# Patient Record
Sex: Female | Born: 1990
Health system: Southern US, Community
[De-identification: ages and names within clinical notes are randomized; demographics above are authoritative.]

## PROBLEM LIST (undated history)

## (undated) DIAGNOSIS — Z789 Other specified health status: Secondary | ICD-10-CM

---
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
## 2010-01-31 ENCOUNTER — Emergency Department (HOSPITAL_COMMUNITY): Admission: EM | Admit: 2010-01-31 | Discharge: 2010-01-31 | Payer: Self-pay | Admitting: Emergency Medicine

## 2010-02-15 ENCOUNTER — Emergency Department (HOSPITAL_COMMUNITY): Admission: EM | Admit: 2010-02-15 | Discharge: 2010-02-15 | Payer: Self-pay | Admitting: Emergency Medicine

## 2010-03-31 ENCOUNTER — Emergency Department (HOSPITAL_COMMUNITY): Admission: EM | Admit: 2010-03-31 | Discharge: 2010-03-31 | Payer: Self-pay | Admitting: Emergency Medicine

## 2010-05-24 ENCOUNTER — Inpatient Hospital Stay (HOSPITAL_COMMUNITY): Admission: AD | Admit: 2010-05-24 | Discharge: 2010-05-26 | Payer: Self-pay | Admitting: Family Medicine

## 2010-05-24 ENCOUNTER — Ambulatory Visit: Payer: Self-pay | Admitting: Obstetrics and Gynecology

## 2011-03-14 LAB — CBC
MCHC: 33.1 g/dL (ref 30.0–36.0)
MCV: 72.8 fL — ABNORMAL LOW (ref 78.0–100.0)
Platelets: 229 10*3/uL (ref 150–400)
Platelets: 240 10*3/uL (ref 150–400)
RBC: 3.91 MIL/uL (ref 3.87–5.11)
RDW: 18.3 % — ABNORMAL HIGH (ref 11.5–15.5)
RDW: 18.8 % — ABNORMAL HIGH (ref 11.5–15.5)
WBC: 14.4 10*3/uL — ABNORMAL HIGH (ref 4.0–10.5)

## 2011-03-14 LAB — RPR: RPR Ser Ql: NONREACTIVE

## 2011-03-16 LAB — URINALYSIS, ROUTINE W REFLEX MICROSCOPIC
Bilirubin Urine: NEGATIVE
Nitrite: NEGATIVE
Specific Gravity, Urine: 1.02 (ref 1.005–1.030)
Urobilinogen, UA: 0.2 mg/dL (ref 0.0–1.0)
pH: 6.5 (ref 5.0–8.0)

## 2011-03-16 LAB — URINE MICROSCOPIC-ADD ON
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
## 2014-04-30 ENCOUNTER — Ambulatory Visit (INDEPENDENT_AMBULATORY_CARE_PROVIDER_SITE_OTHER): Payer: Medicaid Other | Admitting: Advanced Practice Midwife

## 2014-04-30 ENCOUNTER — Encounter: Payer: Self-pay | Admitting: Advanced Practice Midwife

## 2014-04-30 VITALS — BP 122/78 | Ht 59.0 in | Wt 137.0 lb

## 2014-04-30 DIAGNOSIS — Z3201 Encounter for pregnancy test, result positive: Secondary | ICD-10-CM

## 2014-04-30 LAB — POCT URINE PREGNANCY: PREG TEST UR: POSITIVE

## 2014-04-30 NOTE — Progress Notes (Signed)
Patient ID: Verlan FriendsNathaly Broom, female   DOB: 08/04/1991, 23 y.o.   MRN: [id number] Pt is here today for pregnancy test, resulted positive.  LMP was 11/29/13, home pregnancy test in Jan, Feb and March all negative, did not take any tests after the first of March.  Will get Quant HCG today and call with results tomorrow to determine when to have her return for u/s and new OB appt.

## 2014-05-01 ENCOUNTER — Telehealth: Payer: Self-pay | Admitting: Obstetrics and Gynecology

## 2014-05-01 LAB — HCG, QUANTITATIVE, PREGNANCY: HCG, BETA CHAIN, QUANT, S: 710.7 m[IU]/mL

## 2014-05-01 NOTE — Telephone Encounter (Signed)
Pt aware of results and aware to have blood work redrawn tomorrow per [employer]. Pt aware that she will have to pay the same as before. Pt switched up front to make appointment.

## 2014-05-02 ENCOUNTER — Other Ambulatory Visit: Payer: Medicaid Other

## 2014-05-02 DIAGNOSIS — Z3200 Encounter for pregnancy test, result unknown: Secondary | ICD-10-CM

## 2014-05-03 LAB — HCG, QUANTITATIVE, PREGNANCY: hCG, Beta Chain, Quant, S: 1718.9 m[IU]/mL

## 2014-05-04 NOTE — Telephone Encounter (Signed)
Pt aware of healthy increase in HCG results.

## 2014-05-26 ENCOUNTER — Other Ambulatory Visit: Payer: Self-pay | Admitting: Obstetrics and Gynecology

## 2014-05-26 DIAGNOSIS — O3680X0 Pregnancy with inconclusive fetal viability, not applicable or unspecified: Secondary | ICD-10-CM

## 2014-05-27 ENCOUNTER — Ambulatory Visit (INDEPENDENT_AMBULATORY_CARE_PROVIDER_SITE_OTHER): Payer: Medicaid Other

## 2014-05-27 ENCOUNTER — Other Ambulatory Visit: Payer: Self-pay | Admitting: Obstetrics and Gynecology

## 2014-05-27 DIAGNOSIS — O26849 Uterine size-date discrepancy, unspecified trimester: Secondary | ICD-10-CM

## 2014-05-27 DIAGNOSIS — O3680X0 Pregnancy with inconclusive fetal viability, not applicable or unspecified: Secondary | ICD-10-CM

## 2014-05-27 DIAGNOSIS — O34219 Maternal care for unspecified type scar from previous cesarean delivery: Secondary | ICD-10-CM

## 2014-05-27 NOTE — Progress Notes (Signed)
U/S-single IUP with +FCA noted, FHR-169 bpm, CRL c/w 8+0wks EDD 01/06/2015, cx appears closed, bilateral adnexa appears WNL with C.L. Noted on Rt

## 2014-06-04 ENCOUNTER — Encounter: Payer: Medicaid Other | Admitting: Advanced Practice Midwife

## 2014-06-11 ENCOUNTER — Other Ambulatory Visit (HOSPITAL_COMMUNITY)
Admission: RE | Admit: 2014-06-11 | Discharge: 2014-06-11 | Disposition: A | Discharge status: Home / Self Care | Payer: Medicaid Other | Source: Ambulatory Visit | Attending: Advanced Practice Midwife | Admitting: Advanced Practice Midwife

## 2014-06-11 ENCOUNTER — Encounter: Payer: Self-pay | Admitting: Advanced Practice Midwife

## 2014-06-11 ENCOUNTER — Ambulatory Visit (INDEPENDENT_AMBULATORY_CARE_PROVIDER_SITE_OTHER): Payer: Medicaid Other | Admitting: Advanced Practice Midwife

## 2014-06-11 VITALS — BP 92/60 | Wt 138.0 lb

## 2014-06-11 DIAGNOSIS — Z01419 Encounter for gynecological examination (general) (routine) without abnormal findings: Secondary | ICD-10-CM | POA: Insufficient documentation

## 2014-06-11 DIAGNOSIS — Z98891 History of uterine scar from previous surgery: Secondary | ICD-10-CM | POA: Insufficient documentation

## 2014-06-11 DIAGNOSIS — Z331 Pregnant state, incidental: Secondary | ICD-10-CM

## 2014-06-11 DIAGNOSIS — Z113 Encounter for screening for infections with a predominantly sexual mode of transmission: Secondary | ICD-10-CM | POA: Insufficient documentation

## 2014-06-11 DIAGNOSIS — O34219 Maternal care for unspecified type scar from previous cesarean delivery: Secondary | ICD-10-CM

## 2014-06-11 DIAGNOSIS — O99011 Anemia complicating pregnancy, first trimester: Secondary | ICD-10-CM

## 2014-06-11 DIAGNOSIS — Z3480 Encounter for supervision of other normal pregnancy, unspecified trimester: Secondary | ICD-10-CM

## 2014-06-11 DIAGNOSIS — Z124 Encounter for screening for malignant neoplasm of cervix: Secondary | ICD-10-CM

## 2014-06-11 DIAGNOSIS — Z1389 Encounter for screening for other disorder: Secondary | ICD-10-CM

## 2014-06-11 LAB — CBC
HEMATOCRIT: 37.4 % (ref 36.0–46.0)
Hemoglobin: 12.9 g/dL (ref 12.0–15.0)
MCH: 29.9 pg (ref 26.0–34.0)
MCHC: 34.5 g/dL (ref 30.0–36.0)
MCV: 86.8 fL (ref 78.0–100.0)
Platelets: 277 10*3/uL (ref 150–400)
RBC: 4.31 MIL/uL (ref 3.87–5.11)
RDW: 13.9 % (ref 11.5–15.5)
WBC: 8.9 10*3/uL (ref 4.0–10.5)

## 2014-06-11 LAB — POCT URINALYSIS DIPSTICK
Blood, UA: NEGATIVE
Glucose, UA: NEGATIVE
Ketones, UA: NEGATIVE
Nitrite, UA: NEGATIVE
PROTEIN UA: NEGATIVE

## 2014-06-11 NOTE — Progress Notes (Signed)
  Subjective:    [name] is a [id number] being seen today for her first obstetrical visit.  Her obstetrical history is significant for hx of cesarean section and hx of VBAC.  Pregnancy history fully reviewed.  Her c/s was after a failed forceps attempt.  2nd baby was an easy VBAC.  Patient reports no complaints.  Filed Vitals:   06/11/14 1411  BP: 92/60  Weight: 138 lb (62.596 kg)    HISTORY: OB History  Gravida Para Term Preterm AB SAB TAB Ectopic Multiple Living  4 2 2  1     2     # Outcome Date GA Lbr Len/2nd Weight Sex Delivery Anes PTL Lv  4 CUR           3 TRM 05/24/10 6265w0d  7 lb 5 oz (3.317 kg) F VBAC EPI  Y  2 TRM 06/22/08 516w0d  8 lb 7 oz (3.827 kg) M CS EPI  Y     Comments: failed forceps  1 ABT 12/2006             History reviewed. No pertinent past medical history. Past Surgical History  Procedure Laterality Date  . Cesarean section  06/22/2008   Family History  Problem Relation Age of Onset  . Diabetes Maternal Grandmother   . Hypertension Maternal Grandmother   . Stroke Maternal Grandmother   . Miscarriages / [place] Mother      Exam       Pelvic Exam:    Perineum: Normal Perineum   Vulva: normal   Vagina:  normal mucosa, normal discharge, no palpable nodules   Uterus            Cervix: Normal  PAP collected   Adnexa: Not palpable   Urinary:  urethral meatus normal    System: Breast:  normal appearance, no masses or tenderness   Skin: normal coloration and turgor, no rashes    Neurologic: oriented, normal, normal mood   Extremities: normal strength, tone, and muscle mass   HEENT PERRLA   Mouth/Teeth mucous membranes moist, normal dentition   Neck supple and no masses   Cardiovascular: regular rate and rhythm   Respiratory:  appears well, vitals normal, no respiratory distress, acyanotic, normal RR   Abdomen: soft, non-tender;  FHR: 150 u/s          Assessment:    Pregnancy: [id number] Patient Active Problem List   Diagnosis Date Noted  . Supervision of other normal pregnancy 06/11/2014  . History of successful vaginal birth after cesarean, currently pregnant 06/11/2014        Plan:     Initial labs drawn. Continue prenatal vitamins  Problem list reviewed and updated  Reviewed n/v relief measures and warning s/s to report  Reviewed recommended weight gain based on pre-gravid BMI  Encouraged well-balanced diet Genetic Screening discussed Integrated Screen: requested.  Ultrasound discussed; fetal survey: requested.  Follow up in 2 weeks for NT/IT  CRESENZO-DISHMAN,FRANCES 06/11/2014

## 2014-06-11 NOTE — Patient Instructions (Signed)

## 2014-06-12 LAB — ABO AND RH: RH TYPE: POSITIVE

## 2014-06-12 LAB — DRUG SCREEN, URINE, NO CONFIRMATION
Amphetamine Screen, Ur: NEGATIVE
BENZODIAZEPINES.: NEGATIVE
Barbiturate Quant, Ur: NEGATIVE
COCAINE METABOLITES: NEGATIVE
CREATININE, U: 163.2 mg/dL
Marijuana Metabolite: NEGATIVE
Methadone: NEGATIVE
Opiate Screen, Urine: NEGATIVE
PHENCYCLIDINE (PCP): NEGATIVE
Propoxyphene: NEGATIVE

## 2014-06-12 LAB — URINALYSIS, ROUTINE W REFLEX MICROSCOPIC
Bilirubin Urine: NEGATIVE
Glucose, UA: NEGATIVE mg/dL
HGB URINE DIPSTICK: NEGATIVE
Ketones, ur: NEGATIVE mg/dL
LEUKOCYTES UA: NEGATIVE
Nitrite: NEGATIVE
PROTEIN: NEGATIVE mg/dL
Specific Gravity, Urine: 1.023 (ref 1.005–1.030)
UROBILINOGEN UA: 1 mg/dL (ref 0.0–1.0)
pH: 8 (ref 5.0–8.0)

## 2014-06-12 LAB — RUBELLA SCREEN: RUBELLA: 1.9 {index} — AB (ref <0.90)

## 2014-06-12 LAB — OXYCODONE SCREEN, UA, RFLX CONFIRM: Oxycodone Screen, Ur: NEGATIVE ng/mL

## 2014-06-12 LAB — HEPATITIS B SURFACE ANTIGEN: HEP B S AG: NEGATIVE

## 2014-06-12 LAB — RPR

## 2014-06-12 LAB — VARICELLA ZOSTER ANTIBODY, IGG: VARICELLA IGG: 735 {index} — AB (ref <135.00)

## 2014-06-12 LAB — ANTIBODY SCREEN: ANTIBODY SCREEN: NEGATIVE

## 2014-06-12 LAB — HIV ANTIBODY (ROUTINE TESTING W REFLEX): HIV 1&2 Ab, 4th Generation: NONREACTIVE

## 2014-06-12 LAB — SICKLE CELL SCREEN: Sickle Cell Screen: NEGATIVE

## 2014-06-13 LAB — URINE CULTURE
Colony Count: NO GROWTH
Organism ID, Bacteria: NO GROWTH

## 2014-06-13 LAB — CYTOLOGY - PAP

## 2014-06-17 LAB — CYSTIC FIBROSIS DIAGNOSTIC STUDY

## 2014-06-25 ENCOUNTER — Encounter: Payer: Self-pay | Admitting: Women's Health

## 2014-06-25 ENCOUNTER — Ambulatory Visit (INDEPENDENT_AMBULATORY_CARE_PROVIDER_SITE_OTHER): Payer: Medicaid Other | Admitting: Women's Health

## 2014-06-25 ENCOUNTER — Encounter: Payer: Medicaid Other | Admitting: Advanced Practice Midwife

## 2014-06-25 ENCOUNTER — Other Ambulatory Visit: Payer: Self-pay | Admitting: Advanced Practice Midwife

## 2014-06-25 ENCOUNTER — Ambulatory Visit (INDEPENDENT_AMBULATORY_CARE_PROVIDER_SITE_OTHER): Payer: Medicaid Other

## 2014-06-25 VITALS — BP 110/76 | Wt 138.0 lb

## 2014-06-25 DIAGNOSIS — Z36 Encounter for antenatal screening of mother: Secondary | ICD-10-CM

## 2014-06-25 DIAGNOSIS — Z1389 Encounter for screening for other disorder: Secondary | ICD-10-CM

## 2014-06-25 DIAGNOSIS — Z3480 Encounter for supervision of other normal pregnancy, unspecified trimester: Secondary | ICD-10-CM

## 2014-06-25 DIAGNOSIS — Z331 Pregnant state, incidental: Secondary | ICD-10-CM

## 2014-06-25 DIAGNOSIS — Z3481 Encounter for supervision of other normal pregnancy, first trimester: Secondary | ICD-10-CM

## 2014-06-25 LAB — POCT URINALYSIS DIPSTICK
GLUCOSE UA: NEGATIVE
Ketones, UA: NEGATIVE
Leukocytes, UA: NEGATIVE
NITRITE UA: NEGATIVE
Protein, UA: NEGATIVE
RBC UA: NEGATIVE

## 2014-06-25 MED ORDER — DOXYLAMINE-PYRIDOXINE 10-10 MG PO TBEC: DELAYED_RELEASE_TABLET | ORAL | Status: DC

## 2014-06-25 NOTE — Progress Notes (Signed)
Pt denies any problems or concerns at this time.  

## 2014-06-25 NOTE — Patient Instructions (Signed)
Nausea & Vomiting  Have saltine crackers or pretzels by your bed and eat a few bites before you raise your head out of bed in the morning  Eat small frequent meals throughout the day instead of large meals  Drink plenty of fluids throughout the day to stay hydrated, just don't drink a lot of fluids with your meals.  This can make your stomach fill up faster making you feel sick  Do not brush your teeth right after you eat  Products with real ginger are good for nausea, like ginger ale and ginger hard candy Make sure it says made with real ginger!  Sucking on sour candy like lemon heads is also good for nausea  If your prenatal vitamins make you nauseated, take them at night so you will sleep through the nausea  If you feel like you need medicine for the nausea & vomiting please let us know  If you are unable to keep any fluids or food down please let us know    Pregnancy - First Trimester During sexual intercourse, millions of sperm go into the vagina. Only 1 sperm will penetrate and fertilize the female egg while it is in the Fallopian tube. One week later, the fertilized egg implants into the wall of the uterus. An embryo begins to develop into a baby. At 6 to 8 weeks, the eyes and face are formed and the heartbeat can be seen on ultrasound. At the end of 12 weeks (first trimester), all the baby's organs are formed. Now that you are pregnant, you will want to do everything you can to have a healthy baby. Two of the most important things are to get good prenatal care and follow your caregiver's instructions. Prenatal care is all the medical care you receive before the baby's birth. It is given to prevent, find, and treat problems during the pregnancy and childbirth. PRENATAL EXAMS  During prenatal visits, your weight, blood pressure, and urine are checked. This is done to make sure you are healthy and progressing normally during the pregnancy.  A pregnant woman should gain 25 to 35 pounds  during the pregnancy. However, if you are overweight or underweight, your caregiver will advise you regarding your weight.  Your caregiver will ask and answer questions for you.  Blood work, cervical cultures, other necessary tests, and a Pap test are done during your prenatal exams. These tests are done to check on your health and the probable health of your baby. Tests are strongly recommended and done for HIV with your permission. This is the virus that causes AIDS. These tests are done because medicines can be given to help prevent your baby from being born with this infection should you have been infected without knowing it. Blood work is also used to find out your blood type, previous infections, and follow your blood levels (hemoglobin).  Low hemoglobin (anemia) is common during pregnancy. Iron and vitamins are given to help prevent this. Later in the pregnancy, blood tests for diabetes will be done along with any other tests if any problems develop.  You may need other tests to make sure you and the baby are doing well. CHANGES DURING THE FIRST TRIMESTER  Your body goes through many changes during pregnancy. They vary from person to person. Talk to your caregiver about changes you notice and are concerned about. Changes can include:  Your menstrual period stops.  The egg and sperm carry the genes that determine what you look like. Genes from you   and your partner are forming a baby. The female genes determine whether the baby is a boy or a girl.  Your body increases in girth and you may feel bloated.  Feeling sick to your stomach (nauseous) and throwing up (vomiting). If the vomiting is uncontrollable, call your caregiver.  Your breasts will begin to enlarge and become tender.  Your nipples may stick out more and become darker.  The need to urinate more. Painful urination may mean you have a bladder infection.  Tiring easily.  Loss of appetite.  Cravings for certain kinds of  food.  At first, you may gain or lose a couple of pounds.  You may have changes in your emotions from day to day (excited to be pregnant or concerned something may go wrong with the pregnancy and baby).  You may have more vivid and strange dreams. HOME CARE INSTRUCTIONS   It is very important to avoid all smoking, alcohol and non-prescribed drugs during your pregnancy. These affect the formation and growth of the baby. Avoid chemicals while pregnant to ensure the delivery of a healthy infant.  Start your prenatal visits by the 12th week of pregnancy. They are usually scheduled monthly at first, then more often in the last 2 months before delivery. Keep your caregiver's appointments. Follow your caregiver's instructions regarding medicine use, blood and lab tests, exercise, and diet.  During pregnancy, you are providing food for you and your baby. Eat regular, well-balanced meals. Choose foods such as meat, fish, milk and other low fat dairy products, vegetables, fruits, and whole-grain breads and cereals. Your caregiver will tell you of the ideal weight gain.  You can help morning sickness by keeping soda crackers at the bedside. Eat a couple before arising in the morning. You may want to use the crackers without salt on them.  Eating 4 to 5 small meals rather than 3 large meals a day also may help the nausea and vomiting.  Drinking liquids between meals instead of during meals also seems to help nausea and vomiting.  A physical sexual relationship may be continued throughout pregnancy if there are no other problems. Problems may be early (premature) leaking of amniotic fluid from the membranes, vaginal bleeding, or belly (abdominal) pain.  Exercise regularly if there are no restrictions. Check with your caregiver or physical therapist if you are unsure of the safety of some of your exercises. Greater weight gain will occur in the last 2 trimesters of pregnancy. Exercising will  help:  Control your weight.  Keep you in shape.  Prepare you for labor and delivery.  Help you lose your pregnancy weight after you deliver your baby.  Wear a good support or jogging bra for breast tenderness during pregnancy. This may help if worn during sleep too.  Ask when prenatal classes are available. Begin classes when they are offered.  Do not use hot tubs, steam rooms, or saunas.  Wear your seat belt when driving. This protects you and your baby if you are in an accident.  Avoid raw meat, uncooked cheese, cat litter boxes, and soil used by cats throughout the pregnancy. These carry germs that can cause birth defects in the baby.  The first trimester is a good time to visit your dentist for your dental health. Getting your teeth cleaned is okay. Use a softer toothbrush and brush gently during pregnancy.  Ask for help if you have financial, counseling, or nutritional needs during pregnancy. Your caregiver will be able to offer counseling for   these needs as well as refer you for other special needs.  Do not take any medicines or herbs unless told by your caregiver.  Inform your caregiver if there is any mental or physical domestic violence.  Make a list of emergency phone numbers of family, friends, hospital, and police and fire departments.  Write down your questions. Take them to your prenatal visit.  Do not douche.  Do not cross your legs.  If you have to stand for long periods of time, rotate you feet or take small steps in a circle.  You may have more vaginal secretions that may require a sanitary pad. Do not use tampons or scented sanitary pads. MEDICINES AND DRUG USE IN PREGNANCY  Take prenatal vitamins as directed. The vitamin should contain 1 milligram of folic acid. Keep all vitamins out of reach of children. Only a couple vitamins or tablets containing iron may be fatal to a baby or young child when ingested.  Avoid use of all medicines, including herbs,  over-the-counter medicines, not prescribed or suggested by your caregiver. Only take over-the-counter or prescription medicines for pain, discomfort, or fever as directed by your caregiver. Do not use aspirin, ibuprofen, or naproxen unless directed by your caregiver.  Let your caregiver also know about herbs you may be using.  Alcohol is related to a number of birth defects. This includes fetal alcohol syndrome. All alcohol, in any form, should be avoided completely. Smoking will cause low birth rate and premature babies.  Street or illegal drugs are very harmful to the baby. They are absolutely forbidden. A baby born to an addicted mother will be addicted at birth. The baby will go through the same withdrawal an adult does.  Let your caregiver know about any medicines that you have to take and for what reason you take them. SEEK MEDICAL CARE IF:  You have any concerns or worries during your pregnancy. It is better to call with your questions if you feel they cannot wait, rather than worry about them. SEEK IMMEDIATE MEDICAL CARE IF:   An unexplained oral temperature above 102 F (38.9 C) develops, or as your caregiver suggests.  You have leaking of fluid from the vagina (birth canal). If leaking membranes are suspected, take your temperature and inform your caregiver of this when you call.  There is vaginal spotting or bleeding. Notify your caregiver of the amount and how many pads are used.  You develop a bad smelling vaginal discharge with a change in the color.  You continue to feel sick to your stomach (nauseated) and have no relief from remedies suggested. You vomit blood or coffee ground-like materials.  You lose more than 2 pounds of weight in 1 week.  You gain more than 2 pounds of weight in 1 week and you notice swelling of your face, hands, feet, or legs.  You gain 5 pounds or more in 1 week (even if you do not have swelling of your hands, face, legs, or feet).  You get  exposed to German measles and have never had them.  You are exposed to fifth disease or chickenpox.  You develop belly (abdominal) pain. Round ligament discomfort is a common non-cancerous (benign) cause of abdominal pain in pregnancy. Your caregiver still must evaluate this.  You develop headache, fever, diarrhea, pain with urination, or shortness of breath.  You fall or are in a car accident or have any kind of trauma.  There is mental or physical violence in your home. Document   Released: 12/06/2001 Document Revised: 09/05/2012 Document Reviewed: 10/22/2013 [employer] Patient Information 2015 [employer]. This information is not intended to replace advice given to you by your health care provider. Make sure you discuss any questions you have with your health care provider.  

## 2014-06-25 NOTE — Progress Notes (Signed)
Low-risk OB appointment [id number] Estimated Date of Delivery: 01/06/15 BP 110/76  Wt 138 lb (62.596 kg)  LMP 11/29/2013  BP, weight, and urine reviewed.  Refer to obstetrical flow sheet for FH & FHR.  Denies lof, vb, or uti s/s. Some cramping, some ha's. Nausea daily, no vomiting- requests meds.  Reviewed warning s/s to report. Recommended increasing po hydration, emptying bladder frequently, small frequent snacks/meals, apap prn.  Plan:  Continue routine obstetrical care  F/U in 4wks for OB appointment & 2nd IT Rx diclegis, prior auth sent, and 2 samples given.  1st IT/NT today

## 2014-06-25 NOTE — Progress Notes (Signed)
U/S(12+1wks)-single IUP with +FCA noted, FHR-154 bpm, cx appears closed, bilateral adnexa appears WNL with C.L. Noted on the Rt-2.6cm, no free fluid noted within the pelvis, CRL c/w dates, NB present, NT-1.6944mm, anterior Gr 0 placenta

## 2014-07-02 LAB — MATERNAL SCREEN, INTEGRATED #1

## 2014-07-23 ENCOUNTER — Ambulatory Visit (INDEPENDENT_AMBULATORY_CARE_PROVIDER_SITE_OTHER): Payer: Medicaid Other | Admitting: Women's Health

## 2014-07-23 ENCOUNTER — Encounter: Payer: Self-pay | Admitting: Women's Health

## 2014-07-23 VITALS — BP 98/58 | Wt 143.0 lb

## 2014-07-23 DIAGNOSIS — Z3480 Encounter for supervision of other normal pregnancy, unspecified trimester: Secondary | ICD-10-CM

## 2014-07-23 DIAGNOSIS — Z331 Pregnant state, incidental: Secondary | ICD-10-CM

## 2014-07-23 DIAGNOSIS — Z1389 Encounter for screening for other disorder: Secondary | ICD-10-CM

## 2014-07-23 DIAGNOSIS — O34219 Maternal care for unspecified type scar from previous cesarean delivery: Secondary | ICD-10-CM

## 2014-07-23 DIAGNOSIS — Z3482 Encounter for supervision of other normal pregnancy, second trimester: Secondary | ICD-10-CM

## 2014-07-23 LAB — POCT URINALYSIS DIPSTICK
Blood, UA: NEGATIVE
Glucose, UA: NEGATIVE
KETONES UA: NEGATIVE
NITRITE UA: NEGATIVE
PROTEIN UA: NEGATIVE

## 2014-07-23 NOTE — Addendum Note (Signed)
Addended by: Gaylyn RongEVANS, Jannae Fagerstrom A on: 07/23/2014 02:12 PM   Modules accepted: Orders

## 2014-07-23 NOTE — Progress Notes (Signed)
Low-risk OB appointment [id number] Estimated Date of Delivery: 01/06/15 BP 98/58  Wt 143 lb (64.864 kg)  LMP 11/29/2013  BP, weight, and urine reviewed.  Refer to obstetrical flow sheet for FH & FHR.  Reports good fm.  Denies regular uc's, lof, vb, or uti s/s. No complaints. Reviewed warning s/s to report. Plan:  Continue routine obstetrical care  F/U in 4wks for OB appointment and anatomy u/s 2nd IT today

## 2014-07-23 NOTE — Patient Instructions (Signed)
Second Trimester of Pregnancy The second trimester is from week 13 through week 28, months 4 through 6. The second trimester is often a time when you feel your best. Your body has also adjusted to being pregnant, and you begin to feel better physically. Usually, morning sickness has lessened or quit completely, you may have more energy, and you may have an increase in appetite. The second trimester is also a time when the fetus is growing rapidly. At the end of the sixth month, the fetus is about 9 inches long and weighs about 1 pounds. You will likely begin to feel the baby move (quickening) between 18 and 20 weeks of the pregnancy. BODY CHANGES Your body goes through many changes during pregnancy. The changes vary from woman to woman.   Your weight will continue to increase. You will notice your lower abdomen bulging out.  You may begin to get stretch marks on your hips, abdomen, and breasts.  You may develop headaches that can be relieved by medicines approved by your health care provider.  You may urinate more often because the fetus is pressing on your bladder.  You may develop or continue to have heartburn as a result of your pregnancy.  You may develop constipation because certain hormones are causing the muscles that push waste through your intestines to slow down.  You may develop hemorrhoids or swollen, bulging veins (varicose veins).  You may have back pain because of the weight gain and pregnancy hormones relaxing your joints between the bones in your pelvis and as a result of a shift in weight and the muscles that support your balance.  Your breasts will continue to grow and be tender.  Your gums may bleed and may be sensitive to brushing and flossing.  Dark spots or blotches (chloasma, mask of pregnancy) may develop on your face. This will likely fade after the baby is born.  A dark line from your belly button to the pubic area (linea nigra) may appear. This will likely fade  after the baby is born.  You may have changes in your hair. These can include thickening of your hair, rapid growth, and changes in texture. Some women also have hair loss during or after pregnancy, or hair that feels dry or thin. Your hair will most likely return to normal after your baby is born. WHAT TO EXPECT AT YOUR PRENATAL VISITS During a routine prenatal visit:  You will be weighed to make sure you and the fetus are growing normally.  Your blood pressure will be taken.  Your abdomen will be measured to track your baby's growth.  The fetal heartbeat will be listened to.  Any test results from the previous visit will be discussed. Your health care provider may ask you:  How you are feeling.  If you are feeling the baby move.  If you have had any abnormal symptoms, such as leaking fluid, bleeding, severe headaches, or abdominal cramping.  If you have any questions. Other tests that may be performed during your second trimester include:  Blood tests that check for:  Low iron levels (anemia).  Gestational diabetes (between 24 and 28 weeks).  Rh antibodies.  Urine tests to check for infections, diabetes, or protein in the urine.  An ultrasound to confirm the proper growth and development of the baby.  An amniocentesis to check for possible genetic problems.  Fetal screens for spina bifida and Down syndrome. HOME CARE INSTRUCTIONS   Avoid all smoking, herbs, alcohol, and unprescribed   drugs. These chemicals affect the formation and growth of the baby.  Follow your health care provider's instructions regarding medicine use. There are medicines that are either safe or unsafe to take during pregnancy.  Exercise only as directed by your health care provider. Experiencing uterine cramps is a good sign to stop exercising.  Continue to eat regular, healthy meals.  Wear a good support bra for breast tenderness.  Do not use hot tubs, steam rooms, or saunas.  Wear your  seat belt at all times when driving.  Avoid raw meat, uncooked cheese, cat litter boxes, and soil used by cats. These carry germs that can cause birth defects in the baby.  Take your prenatal vitamins.  Try taking a stool softener (if your health care provider approves) if you develop constipation. Eat more high-fiber foods, such as fresh vegetables or fruit and whole grains. Drink plenty of fluids to keep your urine clear or pale yellow.  Take warm sitz baths to soothe any pain or discomfort caused by hemorrhoids. Use hemorrhoid cream if your health care provider approves.  If you develop varicose veins, wear support hose. Elevate your feet for 15 minutes, 3-4 times a day. Limit salt in your diet.  Avoid heavy lifting, wear low heel shoes, and practice good posture.  Rest with your legs elevated if you have leg cramps or low back pain.  Visit your dentist if you have not gone yet during your pregnancy. Use a soft toothbrush to brush your teeth and be gentle when you floss.  A sexual relationship may be continued unless your health care provider directs you otherwise.  Continue to go to all your prenatal visits as directed by your health care provider. SEEK MEDICAL CARE IF:   You have dizziness.  You have mild pelvic cramps, pelvic pressure, or nagging pain in the abdominal area.  You have persistent nausea, vomiting, or diarrhea.  You have a bad smelling vaginal discharge.  You have pain with urination. SEEK IMMEDIATE MEDICAL CARE IF:   You have a fever.  You are leaking fluid from your vagina.  You have spotting or bleeding from your vagina.  You have severe abdominal cramping or pain.  You have rapid weight gain or loss.  You have shortness of breath with chest pain.  You notice sudden or extreme swelling of your face, hands, ankles, feet, or legs.  You have not felt your baby move in over an hour.  You have severe headaches that do not go away with  medicine.  You have vision changes. Document Released: 12/06/2001 Document Revised: 12/17/2013 Document Reviewed: 02/12/2013 [employer] Patient Information 2015 [employer]. This information is not intended to replace advice given to you by your health care provider. Make sure you discuss any questions you have with your health care provider.  

## 2014-07-28 LAB — MATERNAL SCREEN, INTEGRATED #2
AFP MOM MAT SCREEN: 1.12
AFP, SERUM MAT SCREEN: 36.7 ng/mL
Age risk Down Syndrome: 1:1100 {titer}
CROWN RUMP LENGTH MAT SCREEN 2: 60.3 mm
Calculated Gestational Age: 16.3
ESTRIOL FREE MAT SCREEN: 1.18 ng/mL
ESTRIOL MOM MAT SCREEN: 1.22
HCG, MOM MAT SCREEN: 1.6
Inhibin A Dimeric: 251 pg/mL
Inhibin A MoM: 1.52
MSS Trisomy 18 Risk: <1:5000 {titer}
NT MoM: 1.04
NUCHAL TRANSLUCENCY MAT SCREEN 2: 1.44 mm
NUMBER OF FETUSES MAT SCREEN 2: 1
PAPP-A MAT SCREEN: 368 ng/mL
PAPP-A MoM: 0.54
Rish for ONTD: <1:5000 {titer}
hCG, Serum: 54.5 IU/mL

## 2014-07-29 ENCOUNTER — Encounter: Payer: Self-pay | Admitting: Women's Health

## 2014-08-20 ENCOUNTER — Other Ambulatory Visit: Payer: Self-pay | Admitting: Women's Health

## 2014-08-20 ENCOUNTER — Ambulatory Visit (INDEPENDENT_AMBULATORY_CARE_PROVIDER_SITE_OTHER): Payer: Medicaid Other | Admitting: Advanced Practice Midwife

## 2014-08-20 ENCOUNTER — Ambulatory Visit (INDEPENDENT_AMBULATORY_CARE_PROVIDER_SITE_OTHER): Payer: Medicaid Other

## 2014-08-20 ENCOUNTER — Encounter: Payer: Self-pay | Admitting: Advanced Practice Midwife

## 2014-08-20 VITALS — BP 98/54 | Wt 144.0 lb

## 2014-08-20 DIAGNOSIS — Z1389 Encounter for screening for other disorder: Secondary | ICD-10-CM

## 2014-08-20 DIAGNOSIS — Z331 Pregnant state, incidental: Secondary | ICD-10-CM

## 2014-08-20 DIAGNOSIS — O34219 Maternal care for unspecified type scar from previous cesarean delivery: Secondary | ICD-10-CM

## 2014-08-20 DIAGNOSIS — Z3480 Encounter for supervision of other normal pregnancy, unspecified trimester: Secondary | ICD-10-CM

## 2014-08-20 DIAGNOSIS — Z3482 Encounter for supervision of other normal pregnancy, second trimester: Secondary | ICD-10-CM

## 2014-08-20 DIAGNOSIS — O09299 Supervision of pregnancy with other poor reproductive or obstetric history, unspecified trimester: Secondary | ICD-10-CM

## 2014-08-20 LAB — POCT URINALYSIS DIPSTICK
Blood, UA: NEGATIVE
GLUCOSE UA: NEGATIVE
KETONES UA: NEGATIVE
Nitrite, UA: NEGATIVE
PROTEIN UA: NEGATIVE

## 2014-08-20 NOTE — Progress Notes (Signed)
U/S(20+1wks)-active fetus, meas c/w dates, fluid wnl, anterior Gr 0 placenta, cx appears closed (3.3cm), bilateral adnexa appears WNL, FHR-161 bpm, no major abnl noted, female fetus

## 2014-08-20 NOTE — Progress Notes (Signed)
[id number] [redacted]w[redacted]d Estimated Date of Delivery: 01/06/15  Blood pressure 98/54, weight 144 lb (65.318 kg), last menstrual period 11/29/2013.   BP weight and urine results all reviewed and noted.  Please refer to the obstetrical flow sheet for the fundal height and fetal heart rate documentation: Had anatomy scan today: normal female  Patient reports good fetal movement, denies any bleeding and no rupture of membranes symptoms or regular contractions. Patient is without complaints. All questions were answered.  Plan:  Continued routine obstetrical care,   Follow up in 4 weeks for OB appointment,

## 2014-09-17 ENCOUNTER — Ambulatory Visit (INDEPENDENT_AMBULATORY_CARE_PROVIDER_SITE_OTHER): Payer: Medicaid Other | Admitting: Women's Health

## 2014-09-17 ENCOUNTER — Encounter: Payer: Self-pay | Admitting: Women's Health

## 2014-09-17 VITALS — BP 102/60 | Wt 151.0 lb

## 2014-09-17 DIAGNOSIS — Z3482 Encounter for supervision of other normal pregnancy, second trimester: Secondary | ICD-10-CM

## 2014-09-17 DIAGNOSIS — Z1389 Encounter for screening for other disorder: Secondary | ICD-10-CM

## 2014-09-17 DIAGNOSIS — Z3480 Encounter for supervision of other normal pregnancy, unspecified trimester: Secondary | ICD-10-CM

## 2014-09-17 DIAGNOSIS — Z331 Pregnant state, incidental: Secondary | ICD-10-CM

## 2014-09-17 LAB — POCT URINALYSIS DIPSTICK
Blood, UA: NEGATIVE
Glucose, UA: NEGATIVE
KETONES UA: NEGATIVE
LEUKOCYTES UA: NEGATIVE
NITRITE UA: NEGATIVE
Protein, UA: NEGATIVE

## 2014-09-17 NOTE — Progress Notes (Signed)
Low-risk OB appointment [id number] [redacted]w[redacted]d Estimated Date of Delivery: 01/06/15 BP 102/60  Wt 151 lb (68.493 kg)  LMP 11/29/2013  BP, weight, and urine reviewed.  Refer to obstetrical flow sheet for FH & FHR.  Reports good fm.  Denies regular uc's, lof, vb, or uti s/s. Occ episodes where she gets shaky, sweaty, and nauseated- sounds like bp or bs dropping- discussed prevention/relief measures.  Reviewed ptl s/s, fm. Plan:  Continue routine obstetrical care  F/U in 4wks for OB appointment and pn2

## 2014-09-17 NOTE — Patient Instructions (Signed)
You will have your sugar test next visit.  Please do not eat or drink anything after midnight the night before you come, not even water.  You will be here for at least two hours.     Second Trimester of Pregnancy The second trimester is from week 13 through week 28, months 4 through 6. The second trimester is often a time when you feel your best. Your body has also adjusted to being pregnant, and you begin to feel better physically. Usually, morning sickness has lessened or quit completely, you may have more energy, and you may have an increase in appetite. The second trimester is also a time when the fetus is growing rapidly. At the end of the sixth month, the fetus is about 9 inches long and weighs about 1 pounds. You will likely begin to feel the baby move (quickening) between 18 and 20 weeks of the pregnancy. BODY CHANGES Your body goes through many changes during pregnancy. The changes vary from woman to woman.   Your weight will continue to increase. You will notice your lower abdomen bulging out.  You may begin to get stretch marks on your hips, abdomen, and breasts.  You may develop headaches that can be relieved by medicines approved by your health care provider.  You may urinate more often because the fetus is pressing on your bladder.  You may develop or continue to have heartburn as a result of your pregnancy.  You may develop constipation because certain hormones are causing the muscles that push waste through your intestines to slow down.  You may develop hemorrhoids or swollen, bulging veins (varicose veins).  You may have back pain because of the weight gain and pregnancy hormones relaxing your joints between the bones in your pelvis and as a result of a shift in weight and the muscles that support your balance.  Your breasts will continue to grow and be tender.  Your gums may bleed and may be sensitive to brushing and flossing.  Dark spots or blotches (chloasma, mask of  pregnancy) may develop on your face. This will likely fade after the baby is born.  A dark line from your belly button to the pubic area (linea nigra) may appear. This will likely fade after the baby is born.  You may have changes in your hair. These can include thickening of your hair, rapid growth, and changes in texture. Some women also have hair loss during or after pregnancy, or hair that feels dry or thin. Your hair will most likely return to normal after your baby is born. WHAT TO EXPECT AT YOUR PRENATAL VISITS During a routine prenatal visit:  You will be weighed to make sure you and the fetus are growing normally.  Your blood pressure will be taken.  Your abdomen will be measured to track your baby's growth.  The fetal heartbeat will be listened to.  Any test results from the previous visit will be discussed. Your health care provider may ask you:  How you are feeling.  If you are feeling the baby move.  If you have had any abnormal symptoms, such as leaking fluid, bleeding, severe headaches, or abdominal cramping.  If you have any questions. Other tests that may be performed during your second trimester include:  Blood tests that check for:  Low iron levels (anemia).  Gestational diabetes (between 24 and 28 weeks).  Rh antibodies.  Urine tests to check for infections, diabetes, or protein in the urine.  An ultrasound   to confirm the proper growth and development of the baby.  An amniocentesis to check for possible genetic problems.  Fetal screens for spina bifida and Down syndrome. HOME CARE INSTRUCTIONS   Avoid all smoking, herbs, alcohol, and unprescribed drugs. These chemicals affect the formation and growth of the baby.  Follow your health care provider's instructions regarding medicine use. There are medicines that are either safe or unsafe to take during pregnancy.  Exercise only as directed by your health care provider. Experiencing uterine cramps is a  good sign to stop exercising.  Continue to eat regular, healthy meals.  Wear a good support bra for breast tenderness.  Do not use hot tubs, steam rooms, or saunas.  Wear your seat belt at all times when driving.  Avoid raw meat, uncooked cheese, cat litter boxes, and soil used by cats. These carry germs that can cause birth defects in the baby.  Take your prenatal vitamins.  Try taking a stool softener (if your health care provider approves) if you develop constipation. Eat more high-fiber foods, such as fresh vegetables or fruit and whole grains. Drink plenty of fluids to keep your urine clear or pale yellow.  Take warm sitz baths to soothe any pain or discomfort caused by hemorrhoids. Use hemorrhoid cream if your health care provider approves.  If you develop varicose veins, wear support hose. Elevate your feet for 15 minutes, 3-4 times a day. Limit salt in your diet.  Avoid heavy lifting, wear low heel shoes, and practice good posture.  Rest with your legs elevated if you have leg cramps or low back pain.  Visit your dentist if you have not gone yet during your pregnancy. Use a soft toothbrush to brush your teeth and be gentle when you floss.  A sexual relationship may be continued unless your health care provider directs you otherwise.  Continue to go to all your prenatal visits as directed by your health care provider. SEEK MEDICAL CARE IF:   You have dizziness.  You have mild pelvic cramps, pelvic pressure, or nagging pain in the abdominal area.  You have persistent nausea, vomiting, or diarrhea.  You have a bad smelling vaginal discharge.  You have pain with urination. SEEK IMMEDIATE MEDICAL CARE IF:   You have a fever.  You are leaking fluid from your vagina.  You have spotting or bleeding from your vagina.  You have severe abdominal cramping or pain.  You have rapid weight gain or loss.  You have shortness of breath with chest pain.  You notice sudden  or extreme swelling of your face, hands, ankles, feet, or legs.  You have not felt your baby move in over an hour.  You have severe headaches that do not go away with medicine.  You have vision changes. Document Released: 12/06/2001 Document Revised: 12/17/2013 Document Reviewed: 02/12/2013 [employer] Patient Information 2015 [employer]. This information is not intended to replace advice given to you by your health care provider. Make sure you discuss any questions you have with your health care provider.  

## 2014-10-15 ENCOUNTER — Ambulatory Visit (INDEPENDENT_AMBULATORY_CARE_PROVIDER_SITE_OTHER): Payer: Medicaid Other | Admitting: Advanced Practice Midwife

## 2014-10-15 ENCOUNTER — Other Ambulatory Visit: Payer: Medicaid Other

## 2014-10-15 VITALS — BP 110/66 | Wt 157.0 lb

## 2014-10-15 DIAGNOSIS — Z3483 Encounter for supervision of other normal pregnancy, third trimester: Secondary | ICD-10-CM

## 2014-10-15 DIAGNOSIS — Z131 Encounter for screening for diabetes mellitus: Secondary | ICD-10-CM

## 2014-10-15 DIAGNOSIS — Z0184 Encounter for antibody response examination: Secondary | ICD-10-CM

## 2014-10-15 DIAGNOSIS — Z331 Pregnant state, incidental: Secondary | ICD-10-CM

## 2014-10-15 DIAGNOSIS — Z1389 Encounter for screening for other disorder: Secondary | ICD-10-CM

## 2014-10-15 DIAGNOSIS — Z114 Encounter for screening for human immunodeficiency virus [HIV]: Secondary | ICD-10-CM

## 2014-10-15 DIAGNOSIS — Z113 Encounter for screening for infections with a predominantly sexual mode of transmission: Secondary | ICD-10-CM

## 2014-10-15 DIAGNOSIS — Z3482 Encounter for supervision of other normal pregnancy, second trimester: Secondary | ICD-10-CM

## 2014-10-15 DIAGNOSIS — Z23 Encounter for immunization: Secondary | ICD-10-CM

## 2014-10-15 LAB — POCT URINALYSIS DIPSTICK
Blood, UA: NEGATIVE
GLUCOSE UA: NEGATIVE
Ketones, UA: NEGATIVE
Leukocytes, UA: NEGATIVE
NITRITE UA: NEGATIVE
PROTEIN UA: NEGATIVE

## 2014-10-15 LAB — CBC
HEMATOCRIT: 32.2 % — AB (ref 36.0–46.0)
HEMOGLOBIN: 10.8 g/dL — AB (ref 12.0–15.0)
MCH: 29.2 pg (ref 26.0–34.0)
MCHC: 33.5 g/dL (ref 30.0–36.0)
MCV: 87 fL (ref 78.0–100.0)
Platelets: 280 10*3/uL (ref 150–400)
RBC: 3.7 MIL/uL — ABNORMAL LOW (ref 3.87–5.11)
RDW: 13.6 % (ref 11.5–15.5)
WBC: 10.4 10*3/uL (ref 4.0–10.5)

## 2014-10-15 NOTE — Progress Notes (Signed)
[id number] 8910w1d Estimated Date of Delivery: 01/06/15  Blood pressure 110/66, weight 157 lb (71.215 kg), last menstrual period 11/29/2013.   BP weight and urine results all reviewed and noted.  Please refer to the obstetrical flow sheet for the fundal height and fetal heart rate documentation:  Patient reports good fetal movement, denies any bleeding and no rupture of membranes symptoms or regular contractions. Patient c/o vaginal pain/pressure All questions were answered.  Plan:  Continued routine obstetrical care, Doing PN2 today  Follow up in 3 weeks for OB appointment

## 2014-10-16 LAB — HIV ANTIBODY (ROUTINE TESTING W REFLEX): HIV 1&2 Ab, 4th Generation: NONREACTIVE

## 2014-10-16 LAB — RPR

## 2014-10-16 LAB — GLUCOSE TOLERANCE, 2 HOURS W/ 1HR
GLUCOSE, 2 HOUR: 118 mg/dL (ref 70–139)
GLUCOSE, FASTING: 89 mg/dL (ref 70–99)
GLUCOSE: 128 mg/dL (ref 70–170)

## 2014-10-16 LAB — HSV 2 ANTIBODY, IGG

## 2014-10-16 LAB — ANTIBODY SCREEN: Antibody Screen: NEGATIVE

## 2014-10-22 ENCOUNTER — Encounter: Payer: Self-pay | Admitting: Advanced Practice Midwife

## 2014-10-27 ENCOUNTER — Encounter: Payer: Self-pay | Admitting: Women's Health

## 2014-11-05 ENCOUNTER — Ambulatory Visit (INDEPENDENT_AMBULATORY_CARE_PROVIDER_SITE_OTHER): Payer: Medicaid Other | Admitting: Advanced Practice Midwife

## 2014-11-05 ENCOUNTER — Encounter: Payer: Self-pay | Admitting: Advanced Practice Midwife

## 2014-11-05 VITALS — BP 102/58 | Wt 158.0 lb

## 2014-11-05 DIAGNOSIS — Z3483 Encounter for supervision of other normal pregnancy, third trimester: Secondary | ICD-10-CM

## 2014-11-05 DIAGNOSIS — Z1389 Encounter for screening for other disorder: Secondary | ICD-10-CM

## 2014-11-05 DIAGNOSIS — Z331 Pregnant state, incidental: Secondary | ICD-10-CM

## 2014-11-05 LAB — POCT URINALYSIS DIPSTICK
Glucose, UA: NEGATIVE
Ketones, UA: NEGATIVE
Nitrite, UA: NEGATIVE
PROTEIN UA: NEGATIVE
RBC UA: NEGATIVE

## 2014-11-05 NOTE — Progress Notes (Signed)
[id number] 3636w1d Estimated Date of Delivery: 01/06/15  Blood pressure 102/58, weight 158 lb (71.668 kg), last menstrual period 11/29/2013.   BP weight and urine results all reviewed and noted.  Please refer to the obstetrical flow sheet for the fundal height and fetal heart rate documentation:  Patient reports good fetal movement, denies any bleeding and no rupture of membranes symptoms or regular contractions. Patient is without complaints. All questions were answered.  Plan:  Continued routine obstetrical care,   Follow up in 2 weeks for OB appointment,

## 2014-11-19 ENCOUNTER — Encounter: Payer: Self-pay | Admitting: Advanced Practice Midwife

## 2014-11-19 ENCOUNTER — Ambulatory Visit (INDEPENDENT_AMBULATORY_CARE_PROVIDER_SITE_OTHER): Payer: Medicaid Other | Admitting: Advanced Practice Midwife

## 2014-11-19 VITALS — BP 120/70 | Wt 163.5 lb

## 2014-11-19 DIAGNOSIS — Z1389 Encounter for screening for other disorder: Secondary | ICD-10-CM

## 2014-11-19 DIAGNOSIS — Z3483 Encounter for supervision of other normal pregnancy, third trimester: Secondary | ICD-10-CM

## 2014-11-19 DIAGNOSIS — Z331 Pregnant state, incidental: Secondary | ICD-10-CM

## 2014-11-19 LAB — POCT URINALYSIS DIPSTICK
Blood, UA: NEGATIVE
Glucose, UA: NEGATIVE
KETONES UA: NEGATIVE
Nitrite, UA: NEGATIVE
PROTEIN UA: NEGATIVE

## 2014-11-19 NOTE — Progress Notes (Signed)
[id number] 3062w1d Estimated Date of Delivery: 01/06/15  Blood pressure 120/70, weight 163 lb 8 oz (74.163 kg), last menstrual period 11/29/2013.   BP weight and urine results all reviewed and noted.  Please refer to the obstetrical flow sheet for the fundal height and fetal heart rate documentation:  Patient reports good fetal movement, denies any bleeding and no rupture of membranes symptoms or regular contractions. Patient is without complaints. All questions were answered.  Plan:  Continued routine obstetrical care,   Follow up in 2 weeks for OB appointment,

## 2014-12-03 ENCOUNTER — Ambulatory Visit (INDEPENDENT_AMBULATORY_CARE_PROVIDER_SITE_OTHER): Payer: Medicaid Other | Admitting: Advanced Practice Midwife

## 2014-12-03 VITALS — BP 108/68 | Wt 165.0 lb

## 2014-12-03 DIAGNOSIS — Z3483 Encounter for supervision of other normal pregnancy, third trimester: Secondary | ICD-10-CM

## 2014-12-03 DIAGNOSIS — Z331 Pregnant state, incidental: Secondary | ICD-10-CM

## 2014-12-03 DIAGNOSIS — O26843 Uterine size-date discrepancy, third trimester: Secondary | ICD-10-CM

## 2014-12-03 DIAGNOSIS — Z1389 Encounter for screening for other disorder: Secondary | ICD-10-CM

## 2014-12-03 LAB — POCT URINALYSIS DIPSTICK
GLUCOSE UA: NEGATIVE
Ketones, UA: NEGATIVE
Nitrite, UA: NEGATIVE
Protein, UA: NEGATIVE
RBC UA: NEGATIVE

## 2014-12-03 NOTE — Progress Notes (Signed)
[id number] 74106w1d Estimated Date of Delivery: 01/06/15  Blood pressure 108/68, weight 165 lb (74.844 kg), last menstrual period 11/29/2013.   BP weight and urine results all reviewed and noted.  Please refer to the obstetrical flow sheet for the fundal height and fetal heart rate documentation:  Patient reports good fetal movement, denies any bleeding and no rupture of membranes symptoms or regular contractions. Patient is without complaints. All questions were answered.  Plan:  Continued routine obstetrical care,   Follow up in 1 weeks for OB appointment, EFW (plans VBAC)

## 2014-12-09 ENCOUNTER — Ambulatory Visit (INDEPENDENT_AMBULATORY_CARE_PROVIDER_SITE_OTHER): Payer: Medicaid Other | Admitting: Obstetrics & Gynecology

## 2014-12-09 ENCOUNTER — Other Ambulatory Visit: Payer: Medicaid Other

## 2014-12-09 ENCOUNTER — Encounter: Payer: Self-pay | Admitting: Obstetrics & Gynecology

## 2014-12-09 VITALS — BP 110/60 | Wt 165.0 lb

## 2014-12-09 DIAGNOSIS — O3421 Maternal care for scar from previous cesarean delivery: Secondary | ICD-10-CM

## 2014-12-09 DIAGNOSIS — O34219 Maternal care for unspecified type scar from previous cesarean delivery: Secondary | ICD-10-CM

## 2014-12-09 DIAGNOSIS — Z3483 Encounter for supervision of other normal pregnancy, third trimester: Secondary | ICD-10-CM

## 2014-12-09 DIAGNOSIS — Z1389 Encounter for screening for other disorder: Secondary | ICD-10-CM

## 2014-12-09 DIAGNOSIS — Z331 Pregnant state, incidental: Secondary | ICD-10-CM

## 2014-12-09 LAB — POCT URINALYSIS DIPSTICK
Blood, UA: NEGATIVE
GLUCOSE UA: NEGATIVE
Leukocytes, UA: NEGATIVE
NITRITE UA: NEGATIVE
Protein, UA: NEGATIVE

## 2014-12-09 NOTE — Progress Notes (Signed)
[id number] 5014w0d Estimated Date of Delivery: 01/06/15  Blood pressure 110/60, weight 165 lb (74.844 kg), last menstrual period 11/29/2013.   BP weight and urine results all reviewed and noted.  Please refer to the obstetrical flow sheet for the fundal height and fetal heart rate documentation:  Patient reports good fetal movement, denies any bleeding and no rupture of membranes symptoms or regular contractions. Patient is without complaints. All questions were answered.  Plan:  Continued routine obstetrical care,   Follow up in 1 weeks for OB appointment, routine + GBS

## 2014-12-15 ENCOUNTER — Ambulatory Visit (INDEPENDENT_AMBULATORY_CARE_PROVIDER_SITE_OTHER): Payer: Medicaid Other

## 2014-12-15 DIAGNOSIS — O26843 Uterine size-date discrepancy, third trimester: Secondary | ICD-10-CM

## 2014-12-15 NOTE — Progress Notes (Signed)
U/S(36+6wks)-vtx active fetus, EFw 7 lb 3 oz (69th%tile), fluid WNL AFI-15.3cm SDP-6.4cm, FHR_ 128 bpm, anterior Gr 2 placenta, female fetus

## 2014-12-17 ENCOUNTER — Ambulatory Visit (INDEPENDENT_AMBULATORY_CARE_PROVIDER_SITE_OTHER): Payer: Medicaid Other | Admitting: Women's Health

## 2014-12-17 ENCOUNTER — Encounter: Payer: Self-pay | Admitting: Women's Health

## 2014-12-17 VITALS — BP 108/60 | Wt 169.0 lb

## 2014-12-17 DIAGNOSIS — Z118 Encounter for screening for other infectious and parasitic diseases: Secondary | ICD-10-CM

## 2014-12-17 DIAGNOSIS — Z1159 Encounter for screening for other viral diseases: Secondary | ICD-10-CM

## 2014-12-17 DIAGNOSIS — Z331 Pregnant state, incidental: Secondary | ICD-10-CM

## 2014-12-17 DIAGNOSIS — Z3685 Encounter for antenatal screening for Streptococcus B: Secondary | ICD-10-CM

## 2014-12-17 DIAGNOSIS — Z3483 Encounter for supervision of other normal pregnancy, third trimester: Secondary | ICD-10-CM

## 2014-12-17 DIAGNOSIS — Z1389 Encounter for screening for other disorder: Secondary | ICD-10-CM

## 2014-12-17 LAB — POCT URINALYSIS DIPSTICK
Blood, UA: NEGATIVE
Glucose, UA: NEGATIVE
KETONES UA: NEGATIVE
NITRITE UA: NEGATIVE
PROTEIN UA: NEGATIVE

## 2014-12-17 LAB — OB RESULTS CONSOLE GC/CHLAMYDIA
Chlamydia: NEGATIVE
Gonorrhea: NEGATIVE

## 2014-12-17 NOTE — Progress Notes (Signed)
Low-risk OB appointment [id number] 2136w1d Estimated Date of Delivery: 01/06/15 BP 108/60 mmHg  Wt 169 lb (76.658 kg)  LMP 11/29/2013  BP, weight, and urine reviewed.  Refer to obstetrical flow sheet for FH & FHR.  Reports good fm.  Denies lof, vb, or uti s/s. No complaints.  Some regular uc's last night, have spaced out this am.  Had u/s 12/21 for s>d, efw 69% and afi 15.3/sdp 6.4 GBS collected SVE: 3/60/-3, vtx Reviewed labor s/s, fkc. Plan:  Continue routine obstetrical care  F/U in 1wk for OB appointment

## 2014-12-17 NOTE — Patient Instructions (Signed)
Call the office ([phone]) or go to [facility] if:  You begin to have strong, frequent contractions  Your water breaks.  Sometimes it is a big gush of fluid, sometimes it is just a trickle that keeps getting your panties wet or running down your legs  You have vaginal bleeding.  It is normal to have a small amount of spotting if your cervix was checked.   You don't feel your baby moving like normal.  If you don't, get you something to eat and drink and lay down and focus on feeling your baby move.  You should feel at least 10 movements in 2 hours.  If you don't, you should call the office or go to [facility].    Braxton Hicks Contractions Contractions of the uterus can occur throughout pregnancy. Contractions are not always a sign that you are in labor.  WHAT ARE BRAXTON HICKS CONTRACTIONS?  Contractions that occur before labor are called Braxton Hicks contractions, or false labor. Toward the end of pregnancy (32-34 weeks), these contractions can develop more often and may become more forceful. This is not true labor because these contractions do not result in opening (dilatation) and thinning of the cervix. They are sometimes difficult to tell apart from true labor because these contractions can be forceful and people have different pain tolerances. You should not feel embarrassed if you go to the hospital with false labor. Sometimes, the only way to tell if you are in true labor is for your health care provider to look for changes in the cervix. If there are no prenatal problems or other health problems associated with the pregnancy, it is completely safe to be sent home with false labor and await the onset of true labor. HOW CAN YOU TELL THE DIFFERENCE BETWEEN TRUE AND FALSE LABOR? False Labor  The contractions of false labor are usually shorter and not as hard as those of true labor.   The contractions are usually irregular.   The contractions are often felt in the front of  the lower abdomen and in the groin.   The contractions may go away when you walk around or change positions while lying down.   The contractions get weaker and are shorter lasting as time goes on.   The contractions do not usually become progressively stronger, regular, and closer together as with true labor.  True Labor  Contractions in true labor last 30-70 seconds, become very regular, usually become more intense, and increase in frequency.   The contractions do not go away with walking.   The discomfort is usually felt in the top of the uterus and spreads to the lower abdomen and low back.   True labor can be determined by your health care provider with an exam. This will show that the cervix is dilating and getting thinner.  WHAT TO REMEMBER  Keep up with your usual exercises and follow other instructions given by your health care provider.   Take medicines as directed by your health care provider.   Keep your regular prenatal appointments.   Eat and drink lightly if you think you are going into labor.   If Braxton Hicks contractions are making you uncomfortable:   Change your position from lying down or resting to walking, or from walking to resting.   Sit and rest in a tub of warm water.   Drink 2-3 glasses of water. Dehydration may cause these contractions.   Do slow and deep breathing several times an hour.    WHEN SHOULD I SEEK IMMEDIATE MEDICAL CARE? Seek immediate medical care if:  Your contractions become stronger, more regular, and closer together.   You have fluid leaking or gushing from your vagina.   You have a fever.   You pass blood-tinged mucus.   You have vaginal bleeding.   You have continuous abdominal pain.   You have low back pain that you never had before.   You feel your baby's head pushing down and causing pelvic pressure.   Your baby is not moving as much as it used to.  Document Released: 12/12/2005 Document  Revised: 12/17/2013 Document Reviewed: 09/23/2013 [employer] Patient Information 2015 [employer]. This information is not intended to replace advice given to you by your health care provider. Make sure you discuss any questions you have with your health care provider.  

## 2014-12-18 LAB — GC/CHLAMYDIA PROBE AMP
CT Probe RNA: NEGATIVE
GC Probe RNA: NEGATIVE

## 2014-12-18 LAB — STREP B DNA PROBE: STREP GROUP B AG: NOT DETECTED

## 2014-12-24 ENCOUNTER — Ambulatory Visit (INDEPENDENT_AMBULATORY_CARE_PROVIDER_SITE_OTHER): Payer: Medicaid Other | Admitting: Women's Health

## 2014-12-24 ENCOUNTER — Encounter: Payer: Self-pay | Admitting: Women's Health

## 2014-12-24 VITALS — BP 118/60 | Wt 172.0 lb

## 2014-12-24 DIAGNOSIS — O26893 Other specified pregnancy related conditions, third trimester: Secondary | ICD-10-CM

## 2014-12-24 DIAGNOSIS — Z331 Pregnant state, incidental: Secondary | ICD-10-CM

## 2014-12-24 DIAGNOSIS — Z1389 Encounter for screening for other disorder: Secondary | ICD-10-CM

## 2014-12-24 DIAGNOSIS — Z3483 Encounter for supervision of other normal pregnancy, third trimester: Secondary | ICD-10-CM

## 2014-12-24 DIAGNOSIS — N898 Other specified noninflammatory disorders of vagina: Secondary | ICD-10-CM

## 2014-12-24 LAB — POCT URINALYSIS DIPSTICK
Blood, UA: NEGATIVE
Glucose, UA: NEGATIVE
Ketones, UA: NEGATIVE
NITRITE UA: NEGATIVE
Protein, UA: NEGATIVE

## 2014-12-24 LAB — POCT WET PREP (WET MOUNT): CLUE CELLS WET PREP WHIFF POC: NEGATIVE

## 2014-12-24 NOTE — Patient Instructions (Signed)
Call the office ([phone]) or go to [facility] if:  You begin to have strong, frequent contractions  Your water breaks.  Sometimes it is a big gush of fluid, sometimes it is just a trickle that keeps getting your panties wet or running down your legs  You have vaginal bleeding.  It is normal to have a small amount of spotting if your cervix was checked.   You don't feel your baby moving like normal.  If you don't, get you something to eat and drink and lay down and focus on feeling your baby move.  You should feel at least 10 movements in 2 hours.  If you don't, you should call the office or go to [facility].    Braxton Hicks Contractions Contractions of the uterus can occur throughout pregnancy. Contractions are not always a sign that you are in labor.  WHAT ARE BRAXTON HICKS CONTRACTIONS?  Contractions that occur before labor are called Braxton Hicks contractions, or false labor. Toward the end of pregnancy (32-34 weeks), these contractions can develop more often and may become more forceful. This is not true labor because these contractions do not result in opening (dilatation) and thinning of the cervix. They are sometimes difficult to tell apart from true labor because these contractions can be forceful and people have different pain tolerances. You should not feel embarrassed if you go to the hospital with false labor. Sometimes, the only way to tell if you are in true labor is for your health care provider to look for changes in the cervix. If there are no prenatal problems or other health problems associated with the pregnancy, it is completely safe to be sent home with false labor and await the onset of true labor. HOW CAN YOU TELL THE DIFFERENCE BETWEEN TRUE AND FALSE LABOR? False Labor  The contractions of false labor are usually shorter and not as hard as those of true labor.   The contractions are usually irregular.   The contractions are often felt in the front of  the lower abdomen and in the groin.   The contractions may go away when you walk around or change positions while lying down.   The contractions get weaker and are shorter lasting as time goes on.   The contractions do not usually become progressively stronger, regular, and closer together as with true labor.  True Labor  Contractions in true labor last 30-70 seconds, become very regular, usually become more intense, and increase in frequency.   The contractions do not go away with walking.   The discomfort is usually felt in the top of the uterus and spreads to the lower abdomen and low back.   True labor can be determined by your health care provider with an exam. This will show that the cervix is dilating and getting thinner.  WHAT TO REMEMBER  Keep up with your usual exercises and follow other instructions given by your health care provider.   Take medicines as directed by your health care provider.   Keep your regular prenatal appointments.   Eat and drink lightly if you think you are going into labor.   If Braxton Hicks contractions are making you uncomfortable:   Change your position from lying down or resting to walking, or from walking to resting.   Sit and rest in a tub of warm water.   Drink 2-3 glasses of water. Dehydration may cause these contractions.   Do slow and deep breathing several times an hour.    WHEN SHOULD I SEEK IMMEDIATE MEDICAL CARE? Seek immediate medical care if:  Your contractions become stronger, more regular, and closer together.   You have fluid leaking or gushing from your vagina.   You have a fever.   You pass blood-tinged mucus.   You have vaginal bleeding.   You have continuous abdominal pain.   You have low back pain that you never had before.   You feel your baby's head pushing down and causing pelvic pressure.   Your baby is not moving as much as it used to.  Document Released: 12/12/2005 Document  Revised: 12/17/2013 Document Reviewed: 09/23/2013 [employer] Patient Information 2015 [employer]. This information is not intended to replace advice given to you by your health care provider. Make sure you discuss any questions you have with your health care provider.  

## 2014-12-24 NOTE — Progress Notes (Signed)
Low-risk OB appointment [id number] Estimated Date of Delivery: 01/06/15 BP 118/60 mmHg  Wt 172 lb (78.019 kg)  LMP 11/29/2013  BP, weight, and urine reviewed.  Refer to obstetrical flow sheet for FH & FHR.  Reports good fm.  Denies regular uc's, vb, or uti s/s. Leaking since Monday, will happen when sitting down, notices she feels wet.  SSE: cx visually open, no pooling of fluid, no change w/ valsalva. Nitrazine neg, fern neg. Large amount thin yellow frothy d/c- nonodorous. Wet prep neg.  SVE per request: 3.5/60/-3, vtx Reviewed labor s/s, fkc. Plan:  Continue routine obstetrical care  F/U in 1wk for OB appointment

## 2014-12-26 ENCOUNTER — Inpatient Hospital Stay (HOSPITAL_COMMUNITY)
Admission: AD | Admit: 2014-12-26 | Discharge: 2014-12-26 | Disposition: A | Discharge status: Home / Self Care | Payer: Medicaid Other | Source: Ambulatory Visit | Attending: Obstetrics and Gynecology | Admitting: Obstetrics and Gynecology

## 2014-12-26 ENCOUNTER — Encounter (HOSPITAL_COMMUNITY): Payer: Self-pay

## 2014-12-26 DIAGNOSIS — Z3A38 38 weeks gestation of pregnancy: Secondary | ICD-10-CM | POA: Diagnosis not present

## 2014-12-26 DIAGNOSIS — O471 False labor at or after 37 completed weeks of gestation: Secondary | ICD-10-CM | POA: Diagnosis present

## 2014-12-26 HISTORY — DX: Other specified health status: Z78.9

## 2014-12-26 NOTE — MAU Note (Signed)
Contractions since 1 am. Denies LOF or vaginal bleeding. Positive fetal movement.

## 2014-12-26 NOTE — Discharge Instructions (Signed)
Braxton Hicks Contractions °Contractions of the uterus can occur throughout pregnancy. Contractions are not always a sign that you are in labor.  °WHAT ARE BRAXTON HICKS CONTRACTIONS?  °Contractions that occur before labor are called Braxton Hicks contractions, or false labor. Toward the end of pregnancy (32-34 weeks), these contractions can develop more often and may become more forceful. This is not true labor because these contractions do not result in opening (dilatation) and thinning of the cervix. They are sometimes difficult to tell apart from true labor because these contractions can be forceful and people have different pain tolerances. You should not feel embarrassed if you go to the hospital with false labor. Sometimes, the only way to tell if you are in true labor is for your health care provider to look for changes in the cervix. °If there are no prenatal problems or other health problems associated with the pregnancy, it is completely safe to be sent home with false labor and await the onset of true labor. °HOW CAN YOU TELL THE DIFFERENCE BETWEEN TRUE AND FALSE LABOR? °False Labor °· The contractions of false labor are usually shorter and not as hard as those of true labor.   °· The contractions are usually irregular.   °· The contractions are often felt in the front of the lower abdomen and in the groin.   °· The contractions may go away when you walk around or change positions while lying down.   °· The contractions get weaker and are shorter lasting as time goes on.   °· The contractions do not usually become progressively stronger, regular, and closer together as with true labor.   °True Labor °1. Contractions in true labor last 30-70 seconds, become very regular, usually become more intense, and increase in frequency.   °2. The contractions do not go away with walking.   °3. The discomfort is usually felt in the top of the uterus and spreads to the lower abdomen and low back.   °4. True labor can  be determined by your health care provider with an exam. This will show that the cervix is dilating and getting thinner.   °WHAT TO REMEMBER °· Keep up with your usual exercises and follow other instructions given by your health care provider.   °· Take medicines as directed by your health care provider.   °· Keep your regular prenatal appointments.   °· Eat and drink lightly if you think you are going into labor.   °· If Braxton Hicks contractions are making you uncomfortable:   °· Change your position from lying down or resting to walking, or from walking to resting.   °· Sit and rest in a tub of warm water.   °· Drink 2-3 glasses of water. Dehydration may cause these contractions.   °· Do slow and deep breathing several times an hour.   °WHEN SHOULD I SEEK IMMEDIATE MEDICAL CARE? °Seek immediate medical care if: °· Your contractions become stronger, more regular, and closer together.   °· You have fluid leaking or gushing from your vagina.   °· You have a fever.   °· You pass blood-tinged mucus.   °· You have vaginal bleeding.   °· You have continuous abdominal pain.   °· You have low back pain that you never had before.   °· You feel your baby's head pushing down and causing pelvic pressure.   °· Your baby is not moving as much as it used to.   °Document Released: 12/12/2005 Document Revised: 12/17/2013 Document Reviewed: 09/23/2013 °[employer]® Patient Information ©2015 [employer]. This information is not intended to replace advice given to you by your health care   provider. Make sure you discuss any questions you have with your health care provider. ° °Fetal Movement Counts °Patient Name: __________________________________________________ Patient Due Date: ____________________ °Performing a fetal movement count is highly recommended in high-risk pregnancies, but it is good for every pregnant woman to do. Your health care provider may ask you to start counting fetal movements at 28 weeks of the pregnancy. Fetal  movements often increase: °· After eating a full meal. °· After physical activity. °· After eating or drinking something sweet or cold. °· At rest. °Pay attention to when you feel the baby is most active. This will help you notice a pattern of your baby's sleep and wake cycles and what factors contribute to an increase in fetal movement. It is important to perform a fetal movement count at the same time each day when your baby is normally most active.  °HOW TO COUNT FETAL MOVEMENTS °5. Find a quiet and comfortable area to sit or lie down on your left side. Lying on your left side provides the best blood and oxygen circulation to your baby. °6. Write down the day and time on a sheet of paper or in a journal. °7. Start counting kicks, flutters, swishes, rolls, or jabs in a 2-hour period. You should feel at least 10 movements within 2 hours. °8. If you do not feel 10 movements in 2 hours, wait 2-3 hours and count again. Look for a change in the pattern or not enough counts in 2 hours. °SEEK MEDICAL CARE IF: °· You feel less than 10 counts in 2 hours, tried twice. °· There is no movement in over an hour. °· The pattern is changing or taking longer each day to reach 10 counts in 2 hours. °· You feel the baby is not moving as he or she usually does. °Date: ____________ Movements: ____________ Start time: ____________ Finish time: ____________  °Date: ____________ Movements: ____________ Start time: ____________ Finish time: ____________ °Date: ____________ Movements: ____________ Start time: ____________ Finish time: ____________ °Date: ____________ Movements: ____________ Start time: ____________ Finish time: ____________ °Date: ____________ Movements: ____________ Start time: ____________ Finish time: ____________ °Date: ____________ Movements: ____________ Start time: ____________ Finish time: ____________ °Date: ____________ Movements: ____________ Start time: ____________ Finish time: ____________ °Date: ____________  Movements: ____________ Start time: ____________ Finish time: ____________  °Date: ____________ Movements: ____________ Start time: ____________ Finish time: ____________ °Date: ____________ Movements: ____________ Start time: ____________ Finish time: ____________ °Date: ____________ Movements: ____________ Start time: ____________ Finish time: ____________ °Date: ____________ Movements: ____________ Start time: ____________ Finish time: ____________ °Date: ____________ Movements: ____________ Start time: ____________ Finish time: ____________ °Date: ____________ Movements: ____________ Start time: ____________ Finish time: ____________ °Date: ____________ Movements: ____________ Start time: ____________ Finish time: ____________  °Date: ____________ Movements: ____________ Start time: ____________ Finish time: ____________ °Date: ____________ Movements: ____________ Start time: ____________ Finish time: ____________ °Date: ____________ Movements: ____________ Start time: ____________ Finish time: ____________ °Date: ____________ Movements: ____________ Start time: ____________ Finish time: ____________ °Date: ____________ Movements: ____________ Start time: ____________ Finish time: ____________ °Date: ____________ Movements: ____________ Start time: ____________ Finish time: ____________ °Date: ____________ Movements: ____________ Start time: ____________ Finish time: ____________  °Date: ____________ Movements: ____________ Start time: ____________ Finish time: ____________ °Date: ____________ Movements: ____________ Start time: ____________ Finish time: ____________ °Date: ____________ Movements: ____________ Start time: ____________ Finish time: ____________ °Date: ____________ Movements: ____________ Start time: ____________ Finish time: ____________ °Date: ____________ Movements: ____________ Start time: ____________ Finish time: ____________ °Date: ____________ Movements: ____________ Start time:  ____________ Finish time: ____________ °Date: ____________ Movements:   ____________ Start time: ____________ Finish time: ____________  °Date: ____________ Movements: ____________ Start time: ____________ Finish time: ____________ °Date: ____________ Movements: ____________ Start time: ____________ Finish time: ____________ °Date: ____________ Movements: ____________ Start time: ____________ Finish time: ____________ °Date: ____________ Movements: ____________ Start time: ____________ Finish time: ____________ °Date: ____________ Movements: ____________ Start time: ____________ Finish time: ____________ °Date: ____________ Movements: ____________ Start time: ____________ Finish time: ____________ °Date: ____________ Movements: ____________ Start time: ____________ Finish time: ____________  °Date: ____________ Movements: ____________ Start time: ____________ Finish time: ____________ °Date: ____________ Movements: ____________ Start time: ____________ Finish time: ____________ °Date: ____________ Movements: ____________ Start time: ____________ Finish time: ____________ °Date: ____________ Movements: ____________ Start time: ____________ Finish time: ____________ °Date: ____________ Movements: ____________ Start time: ____________ Finish time: ____________ °Date: ____________ Movements: ____________ Start time: ____________ Finish time: ____________ °Date: ____________ Movements: ____________ Start time: ____________ Finish time: ____________  °Date: ____________ Movements: ____________ Start time: ____________ Finish time: ____________ °Date: ____________ Movements: ____________ Start time: ____________ Finish time: ____________ °Date: ____________ Movements: ____________ Start time: ____________ Finish time: ____________ °Date: ____________ Movements: ____________ Start time: ____________ Finish time: ____________ °Date: ____________ Movements: ____________ Start time: ____________ Finish time: ____________ °Date:  ____________ Movements: ____________ Start time: ____________ Finish time: ____________ °Date: ____________ Movements: ____________ Start time: ____________ Finish time: ____________  °Date: ____________ Movements: ____________ Start time: ____________ Finish time: ____________ °Date: ____________ Movements: ____________ Start time: ____________ Finish time: ____________ °Date: ____________ Movements: ____________ Start time: ____________ Finish time: ____________ °Date: ____________ Movements: ____________ Start time: ____________ Finish time: ____________ °Date: ____________ Movements: ____________ Start time: ____________ Finish time: ____________ °Date: ____________ Movements: ____________ Start time: ____________ Finish time: ____________ °Document Released: 01/11/2007 Document Revised: 04/28/2014 Document Reviewed: 10/08/2012 °[employer]® Patient Information ©2015 [employer]. This information is not intended to replace advice given to you by your health care provider. Make sure you discuss any questions you have with your health care provider. ° °

## 2014-12-31 ENCOUNTER — Encounter: Payer: Self-pay | Admitting: Women's Health

## 2014-12-31 ENCOUNTER — Ambulatory Visit (INDEPENDENT_AMBULATORY_CARE_PROVIDER_SITE_OTHER): Payer: Medicaid Other | Admitting: Women's Health

## 2014-12-31 VITALS — BP 112/60 | Wt 173.0 lb

## 2014-12-31 DIAGNOSIS — Z3483 Encounter for supervision of other normal pregnancy, third trimester: Secondary | ICD-10-CM

## 2014-12-31 DIAGNOSIS — Z1389 Encounter for screening for other disorder: Secondary | ICD-10-CM

## 2014-12-31 DIAGNOSIS — Z331 Pregnant state, incidental: Secondary | ICD-10-CM

## 2014-12-31 DIAGNOSIS — O26843 Uterine size-date discrepancy, third trimester: Secondary | ICD-10-CM

## 2014-12-31 DIAGNOSIS — O3421 Maternal care for scar from previous cesarean delivery: Secondary | ICD-10-CM

## 2014-12-31 LAB — POCT URINALYSIS DIPSTICK
Blood, UA: NEGATIVE
Glucose, UA: NEGATIVE
Ketones, UA: NEGATIVE
Nitrite, UA: NEGATIVE

## 2014-12-31 NOTE — Patient Instructions (Signed)
Call the office ([phone]) or go to [facility] if:  You begin to have strong, frequent contractions  Your water breaks.  Sometimes it is a big gush of fluid, sometimes it is just a trickle that keeps getting your panties wet or running down your legs  You have vaginal bleeding.  It is normal to have a small amount of spotting if your cervix was checked.   You don't feel your baby moving like normal.  If you don't, get you something to eat and drink and lay down and focus on feeling your baby move.  You should feel at least 10 movements in 2 hours.  If you don't, you should call the office or go to [facility].    Braxton Hicks Contractions Contractions of the uterus can occur throughout pregnancy. Contractions are not always a sign that you are in labor.  WHAT ARE BRAXTON HICKS CONTRACTIONS?  Contractions that occur before labor are called Braxton Hicks contractions, or false labor. Toward the end of pregnancy (32-34 weeks), these contractions can develop more often and may become more forceful. This is not true labor because these contractions do not result in opening (dilatation) and thinning of the cervix. They are sometimes difficult to tell apart from true labor because these contractions can be forceful and people have different pain tolerances. You should not feel embarrassed if you go to the hospital with false labor. Sometimes, the only way to tell if you are in true labor is for your health care provider to look for changes in the cervix. If there are no prenatal problems or other health problems associated with the pregnancy, it is completely safe to be sent home with false labor and await the onset of true labor. HOW CAN YOU TELL THE DIFFERENCE BETWEEN TRUE AND FALSE LABOR? False Labor  The contractions of false labor are usually shorter and not as hard as those of true labor.   The contractions are usually irregular.   The contractions are often felt in the front of  the lower abdomen and in the groin.   The contractions may go away when you walk around or change positions while lying down.   The contractions get weaker and are shorter lasting as time goes on.   The contractions do not usually become progressively stronger, regular, and closer together as with true labor.  True Labor  Contractions in true labor last 30-70 seconds, become very regular, usually become more intense, and increase in frequency.   The contractions do not go away with walking.   The discomfort is usually felt in the top of the uterus and spreads to the lower abdomen and low back.   True labor can be determined by your health care provider with an exam. This will show that the cervix is dilating and getting thinner.  WHAT TO REMEMBER  Keep up with your usual exercises and follow other instructions given by your health care provider.   Take medicines as directed by your health care provider.   Keep your regular prenatal appointments.   Eat and drink lightly if you think you are going into labor.   If Braxton Hicks contractions are making you uncomfortable:   Change your position from lying down or resting to walking, or from walking to resting.   Sit and rest in a tub of warm water.   Drink 2-3 glasses of water. Dehydration may cause these contractions.   Do slow and deep breathing several times an hour.    WHEN SHOULD I SEEK IMMEDIATE MEDICAL CARE? Seek immediate medical care if:  Your contractions become stronger, more regular, and closer together.   You have fluid leaking or gushing from your vagina.   You have a fever.   You pass blood-tinged mucus.   You have vaginal bleeding.   You have continuous abdominal pain.   You have low back pain that you never had before.   You feel your baby's head pushing down and causing pelvic pressure.   Your baby is not moving as much as it used to.  Document Released: 12/12/2005 Document  Revised: 12/17/2013 Document Reviewed: 09/23/2013 [employer] Patient Information 2015 [employer]. This information is not intended to replace advice given to you by your health care provider. Make sure you discuss any questions you have with your health care provider.  

## 2014-12-31 NOTE — Progress Notes (Signed)
Low-risk OB appointment [id number] 6069w1d Estimated Date of Delivery: 01/06/15 BP 112/60 mmHg  Wt 173 lb (78.472 kg)  LMP 11/29/2013  BP, weight, and urine reviewed.  Refer to obstetrical flow sheet for FH & FHR.  Reports good fm.  Denies lof, vb, or uti s/s. Lots of uc's, nothing that's gotten really close yet.  Vtx by leopold's, feels like a lot of fluid, FH 41cm SVE per request: 4/70/-3, vtx. Offered membrane sweeping, discussed r/b- pt decided to proceed, so membranes swept. Cx now 4.5/70/-3, intact membranes.  Reviewed labor s/s, fkc. Plan:  Afi/efw u/s asap F/U in asap for efw/afi u/s for s>d, then 1 wk for OB appointment

## 2015-01-01 ENCOUNTER — Encounter (HOSPITAL_COMMUNITY)
Admission: AD | Disposition: A | Discharge status: Home / Self Care | Payer: Self-pay | Source: Ambulatory Visit | Attending: Obstetrics & Gynecology

## 2015-01-01 ENCOUNTER — Inpatient Hospital Stay (HOSPITAL_COMMUNITY): Payer: Medicaid Other | Admitting: Anesthesiology

## 2015-01-01 ENCOUNTER — Encounter (HOSPITAL_COMMUNITY): Payer: Self-pay | Admitting: *Deleted

## 2015-01-01 ENCOUNTER — Inpatient Hospital Stay (HOSPITAL_COMMUNITY)
Admission: AD | Admit: 2015-01-01 | Discharge: 2015-01-04 | DRG: 765 | Disposition: A | Discharge status: Home / Self Care | Payer: Medicaid Other | Source: Ambulatory Visit | Attending: Obstetrics & Gynecology | Admitting: Obstetrics & Gynecology

## 2015-01-01 DIAGNOSIS — Z8249 Family history of ischemic heart disease and other diseases of the circulatory system: Secondary | ICD-10-CM

## 2015-01-01 DIAGNOSIS — O3421 Maternal care for scar from previous cesarean delivery: Principal | ICD-10-CM | POA: Diagnosis present

## 2015-01-01 DIAGNOSIS — Z823 Family history of stroke: Secondary | ICD-10-CM | POA: Diagnosis not present

## 2015-01-01 DIAGNOSIS — Z87891 Personal history of nicotine dependence: Secondary | ICD-10-CM

## 2015-01-01 DIAGNOSIS — Z3A39 39 weeks gestation of pregnancy: Secondary | ICD-10-CM | POA: Diagnosis present

## 2015-01-01 DIAGNOSIS — Z833 Family history of diabetes mellitus: Secondary | ICD-10-CM | POA: Diagnosis not present

## 2015-01-01 LAB — CBC
HEMATOCRIT: 32 % — AB (ref 36.0–46.0)
HEMOGLOBIN: 10 g/dL — AB (ref 12.0–15.0)
MCH: 25 pg — AB (ref 26.0–34.0)
MCHC: 31.3 g/dL (ref 30.0–36.0)
MCV: 80 fL (ref 78.0–100.0)
Platelets: 218 10*3/uL (ref 150–400)
RBC: 4 MIL/uL (ref 3.87–5.11)
RDW: 15.8 % — ABNORMAL HIGH (ref 11.5–15.5)
WBC: 11.3 10*3/uL — ABNORMAL HIGH (ref 4.0–10.5)

## 2015-01-01 LAB — ABO/RH: ABO/RH(D): B POS

## 2015-01-01 LAB — TYPE AND SCREEN
ABO/RH(D): B POS
Antibody Screen: NEGATIVE

## 2015-01-01 LAB — RPR

## 2015-01-01 SURGERY — Surgical Case
Anesthesia: Epidural | Site: Abdomen

## 2015-01-01 MED ORDER — FENTANYL 2.5 MCG/ML BUPIVACAINE 1/10 % EPIDURAL INFUSION (WH - ANES)
INTRAMUSCULAR | Status: AC
  Administered 2015-01-01: 14 mL/h via EPIDURAL
  Filled 2015-01-01: qty 125

## 2015-01-01 MED ORDER — OXYCODONE-ACETAMINOPHEN 5-325 MG PO TABS: 1.0000 | ORAL_TABLET | ORAL | Status: DC | PRN

## 2015-01-01 MED ORDER — OXYCODONE-ACETAMINOPHEN 5-325 MG PO TABS: 2.0000 | ORAL_TABLET | ORAL | Status: DC | PRN

## 2015-01-01 MED ORDER — FENTANYL CITRATE 0.05 MG/ML IJ SOLN
INTRAMUSCULAR | Status: AC
  Filled 2015-01-01: qty 2

## 2015-01-01 MED ORDER — EPHEDRINE 5 MG/ML INJ: 10.0000 mg | INTRAVENOUS | Status: DC | PRN

## 2015-01-01 MED ORDER — OXYTOCIN 10 UNIT/ML IJ SOLN
INTRAMUSCULAR | Status: AC
  Filled 2015-01-01: qty 4

## 2015-01-01 MED ORDER — OXYTOCIN BOLUS FROM INFUSION
500.0000 mL | INTRAVENOUS | Status: DC
Start: 2015-01-01 — End: 2015-01-01

## 2015-01-01 MED ORDER — OXYTOCIN 40 UNITS IN LACTATED RINGERS INFUSION - SIMPLE MED
1.0000 m[IU]/min | INTRAVENOUS | Status: DC
  Administered 2015-01-01: 2 m[IU]/min via INTRAVENOUS

## 2015-01-01 MED ORDER — LACTATED RINGERS IV SOLN: 500.0000 mL | INTRAVENOUS | Status: DC | PRN

## 2015-01-01 MED ORDER — METOCLOPRAMIDE HCL 5 MG/ML IJ SOLN
INTRAMUSCULAR | Status: DC | PRN
  Administered 2015-01-01 (×2): 5 mg via INTRAVENOUS

## 2015-01-01 MED ORDER — SCOPOLAMINE 1 MG/3DAYS TD PT72
MEDICATED_PATCH | TRANSDERMAL | Status: AC
  Filled 2015-01-01: qty 1

## 2015-01-01 MED ORDER — KETOROLAC TROMETHAMINE 30 MG/ML IJ SOLN: 30.0000 mg | Freq: Four times a day (QID) | INTRAMUSCULAR | Status: DC | PRN

## 2015-01-01 MED ORDER — PHENYLEPHRINE 40 MCG/ML (10ML) SYRINGE FOR IV PUSH (FOR BLOOD PRESSURE SUPPORT): 80.0000 ug | PREFILLED_SYRINGE | INTRAVENOUS | Status: DC | PRN

## 2015-01-01 MED ORDER — LIDOCAINE HCL (PF) 1 % IJ SOLN: 30.0000 mL | INTRAMUSCULAR | Status: DC | PRN

## 2015-01-01 MED ORDER — KETOROLAC TROMETHAMINE 30 MG/ML IJ SOLN
30.0000 mg | Freq: Four times a day (QID) | INTRAMUSCULAR | Status: DC | PRN
  Administered 2015-01-01: 30 mg via INTRAMUSCULAR

## 2015-01-01 MED ORDER — TERBUTALINE SULFATE 1 MG/ML IJ SOLN: 0.2500 mg | Freq: Once | INTRAMUSCULAR | Status: DC | PRN

## 2015-01-01 MED ORDER — BUPIVACAINE HCL (PF) 0.25 % IJ SOLN
INTRAMUSCULAR | Status: DC | PRN
  Administered 2015-01-01: 1.2 mL

## 2015-01-01 MED ORDER — FENTANYL CITRATE 0.05 MG/ML IJ SOLN: 25.0000 ug | INTRAMUSCULAR | Status: DC | PRN

## 2015-01-01 MED ORDER — LACTATED RINGERS IV SOLN
INTRAVENOUS | Status: DC | PRN
  Administered 2015-01-01: 21:00:00 via INTRAVENOUS

## 2015-01-01 MED ORDER — FENTANYL CITRATE 0.05 MG/ML IJ SOLN
100.0000 ug | INTRAMUSCULAR | Status: DC | PRN
Start: 2015-01-01 — End: 2015-01-01
  Administered 2015-01-01: 100 ug via INTRAVENOUS
  Filled 2015-01-01: qty 2

## 2015-01-01 MED ORDER — DIPHENHYDRAMINE HCL 50 MG/ML IJ SOLN: 12.5000 mg | INTRAMUSCULAR | Status: DC | PRN

## 2015-01-01 MED ORDER — MORPHINE SULFATE (PF) 0.5 MG/ML IJ SOLN
INTRAMUSCULAR | Status: DC | PRN
  Administered 2015-01-01: .15 mg via INTRATHECAL

## 2015-01-01 MED ORDER — MIDAZOLAM HCL 2 MG/2ML IJ SOLN: 0.5000 mg | Freq: Once | INTRAMUSCULAR | Status: DC | PRN

## 2015-01-01 MED ORDER — SCOPOLAMINE 1 MG/3DAYS TD PT72
1.0000 | MEDICATED_PATCH | Freq: Once | TRANSDERMAL | Status: DC
  Administered 2015-01-01: 1.5 mg via TRANSDERMAL

## 2015-01-01 MED ORDER — MORPHINE SULFATE 0.5 MG/ML IJ SOLN
INTRAMUSCULAR | Status: AC
  Filled 2015-01-01: qty 10

## 2015-01-01 MED ORDER — PHENYLEPHRINE 8 MG IN D5W 100 ML (0.08MG/ML) PREMIX OPTIME
INJECTION | INTRAVENOUS | Status: DC | PRN
  Administered 2015-01-01: 60 ug/min via INTRAVENOUS

## 2015-01-01 MED ORDER — MEPERIDINE HCL 25 MG/ML IJ SOLN: 6.2500 mg | INTRAMUSCULAR | Status: DC | PRN

## 2015-01-01 MED ORDER — OXYTOCIN 40 UNITS IN LACTATED RINGERS INFUSION - SIMPLE MED
62.5000 mL/h | INTRAVENOUS | Status: DC
Start: 2015-01-01 — End: 2015-01-01

## 2015-01-01 MED ORDER — TERBUTALINE SULFATE 1 MG/ML IJ SOLN
0.2500 mg | Freq: Once | INTRAMUSCULAR | Status: DC | PRN
Start: 2015-01-01 — End: 2015-01-01

## 2015-01-01 MED ORDER — KETOROLAC TROMETHAMINE 30 MG/ML IJ SOLN
INTRAMUSCULAR | Status: AC
  Filled 2015-01-01: qty 1

## 2015-01-01 MED ORDER — CITRIC ACID-SODIUM CITRATE 334-500 MG/5ML PO SOLN
30.0000 mL | ORAL | Status: DC | PRN
  Administered 2015-01-01: 30 mL via ORAL
  Filled 2015-01-01: qty 15

## 2015-01-01 MED ORDER — METHYLERGONOVINE MALEATE 0.2 MG/ML IJ SOLN
INTRAMUSCULAR | Status: DC | PRN
Start: 2015-01-01 — End: 2015-01-01
  Administered 2015-01-01: 0.2 mg via INTRAMUSCULAR

## 2015-01-01 MED ORDER — FENTANYL CITRATE 0.05 MG/ML IJ SOLN
INTRAMUSCULAR | Status: DC | PRN
  Administered 2015-01-01: 25 ug via INTRATHECAL

## 2015-01-01 MED ORDER — LIDOCAINE HCL (PF) 1 % IJ SOLN
INTRAMUSCULAR | Status: DC | PRN
  Administered 2015-01-01 (×2): 5 mL

## 2015-01-01 MED ORDER — CEFAZOLIN SODIUM-DEXTROSE 2-3 GM-% IV SOLR
2.0000 g | Freq: Once | INTRAVENOUS | Status: DC
  Filled 2015-01-01: qty 50

## 2015-01-01 MED ORDER — ONDANSETRON HCL 4 MG/2ML IJ SOLN
INTRAMUSCULAR | Status: AC
  Filled 2015-01-01: qty 2

## 2015-01-01 MED ORDER — OXYTOCIN 10 UNIT/ML IJ SOLN
40.0000 [IU] | INTRAVENOUS | Status: DC | PRN
  Administered 2015-01-01: 40 [IU] via INTRAVENOUS

## 2015-01-01 MED ORDER — SODIUM BICARBONATE 8.4 % IV SOLN
INTRAVENOUS | Status: DC | PRN
  Administered 2015-01-01: 4 mL via EPIDURAL

## 2015-01-01 MED ORDER — ONDANSETRON HCL 4 MG/2ML IJ SOLN: 4.0000 mg | Freq: Four times a day (QID) | INTRAMUSCULAR | Status: DC | PRN

## 2015-01-01 MED ORDER — ACETAMINOPHEN 325 MG PO TABS: 650.0000 mg | ORAL_TABLET | ORAL | Status: DC | PRN

## 2015-01-01 MED ORDER — OXYTOCIN 40 UNITS IN LACTATED RINGERS INFUSION - SIMPLE MED
1.0000 m[IU]/min | INTRAVENOUS | Status: DC
  Filled 2015-01-01: qty 1000

## 2015-01-01 MED ORDER — FENTANYL 2.5 MCG/ML BUPIVACAINE 1/10 % EPIDURAL INFUSION (WH - ANES)
14.0000 mL/h | INTRAMUSCULAR | Status: DC | PRN
  Administered 2015-01-01 (×2): 14 mL/h via EPIDURAL
  Filled 2015-01-01: qty 125

## 2015-01-01 MED ORDER — KETOROLAC TROMETHAMINE 30 MG/ML IJ SOLN: 30.0000 mg | Freq: Once | INTRAMUSCULAR | Status: DC | PRN

## 2015-01-01 MED ORDER — LACTATED RINGERS IV SOLN
INTRAVENOUS | Status: DC
  Administered 2015-01-01 (×3): via INTRAVENOUS

## 2015-01-01 MED ORDER — ACETAMINOPHEN 325 MG PO TABS: 325.0000 mg | ORAL_TABLET | ORAL | Status: DC | PRN

## 2015-01-01 MED ORDER — LACTATED RINGERS IV SOLN
500.0000 mL | Freq: Once | INTRAVENOUS | Status: AC
  Administered 2015-01-01: 500 mL via INTRAVENOUS

## 2015-01-01 MED ORDER — ACETAMINOPHEN 160 MG/5ML PO SOLN: 325.0000 mg | ORAL | Status: DC | PRN

## 2015-01-01 MED ORDER — DEXAMETHASONE SODIUM PHOSPHATE 10 MG/ML IJ SOLN
INTRAMUSCULAR | Status: DC | PRN
  Administered 2015-01-01: 5 mg via INTRAVENOUS

## 2015-01-01 MED ORDER — PHENYLEPHRINE HCL 10 MG/ML IJ SOLN
INTRAMUSCULAR | Status: DC | PRN
  Administered 2015-01-01 (×2): 80 ug via INTRAVENOUS
  Administered 2015-01-01: 40 ug via INTRAVENOUS
  Administered 2015-01-01 (×2): 80 ug via INTRAVENOUS
  Administered 2015-01-01: 40 ug via INTRAVENOUS

## 2015-01-01 MED ORDER — ONDANSETRON HCL 4 MG/2ML IJ SOLN
INTRAMUSCULAR | Status: DC | PRN
  Administered 2015-01-01: 4 mg via INTRAVENOUS

## 2015-01-01 MED ORDER — PHENYLEPHRINE 40 MCG/ML (10ML) SYRINGE FOR IV PUSH (FOR BLOOD PRESSURE SUPPORT)
PREFILLED_SYRINGE | INTRAVENOUS | Status: AC
  Filled 2015-01-01: qty 20

## 2015-01-01 MED ORDER — FLEET ENEMA 7-19 GM/118ML RE ENEM: 1.0000 | ENEMA | RECTAL | Status: DC | PRN

## 2015-01-01 MED ORDER — PROMETHAZINE HCL 25 MG/ML IJ SOLN: 6.2500 mg | INTRAMUSCULAR | Status: DC | PRN

## 2015-01-01 SURGICAL SUPPLY — 39 items
BENZOIN TINCTURE PRP APPL 2/3 (GAUZE/BANDAGES/DRESSINGS) ×3 IMPLANT
CLAMP CORD UMBIL (MISCELLANEOUS) IMPLANT
CLOSURE WOUND 1/2 X4 (GAUZE/BANDAGES/DRESSINGS) ×1
CLOTH BEACON ORANGE TIMEOUT ST (SAFETY) ×3 IMPLANT
DRAIN JACKSON PRT FLT 7MM (DRAIN) IMPLANT
DRAPE SHEET LG 3/4 BI-LAMINATE (DRAPES) IMPLANT
DRSG OPSITE POSTOP 4X10 (GAUZE/BANDAGES/DRESSINGS) ×3 IMPLANT
DURAPREP 26ML APPLICATOR (WOUND CARE) ×3 IMPLANT
ELECT REM PT RETURN 9FT ADLT (ELECTROSURGICAL) ×3
ELECTRODE REM PT RTRN 9FT ADLT (ELECTROSURGICAL) ×1 IMPLANT
EVACUATOR SILICONE 100CC (DRAIN) IMPLANT
EXTRACTOR VACUUM M CUP 4 TUBE (SUCTIONS) IMPLANT
EXTRACTOR VACUUM M CUP 4' TUBE (SUCTIONS)
FORMULA 20CAL 3 OZ MEAD (FORMULA) ×3 IMPLANT
GLOVE BIO SURGEON STRL SZ7 (GLOVE) ×3 IMPLANT
GLOVE BIOGEL PI IND STRL 7.0 (GLOVE) ×1 IMPLANT
GLOVE BIOGEL PI INDICATOR 7.0 (GLOVE) ×2
GOWN STRL REUS W/TWL LRG LVL3 (GOWN DISPOSABLE) ×6 IMPLANT
HEMOSTAT SURGICEL 4X8 (HEMOSTASIS) ×3 IMPLANT
KIT ABG SYR 3ML LUER SLIP (SYRINGE) ×3 IMPLANT
NEEDLE BLUNT 18X1 FOR OR ONLY (NEEDLE) ×3 IMPLANT
NEEDLE HYPO 25X5/8 SAFETYGLIDE (NEEDLE) ×3 IMPLANT
NS IRRIG 1000ML POUR BTL (IV SOLUTION) ×3 IMPLANT
PACK C SECTION WH (CUSTOM PROCEDURE TRAY) ×3 IMPLANT
PAD ABD 8X7 1/2 STERILE (GAUZE/BANDAGES/DRESSINGS) ×3 IMPLANT
PAD OB MATERNITY 4.3X12.25 (PERSONAL CARE ITEMS) ×3 IMPLANT
RTRCTR C-SECT PINK 25CM LRG (MISCELLANEOUS) ×3 IMPLANT
SPONGE GAUZE 4X4 12PLY STER LF (GAUZE/BANDAGES/DRESSINGS) ×3 IMPLANT
STRIP CLOSURE SKIN 1/2X4 (GAUZE/BANDAGES/DRESSINGS) ×2 IMPLANT
SUT MON AB 2-0 CT1 36 (SUTURE) ×3 IMPLANT
SUT VIC AB 0 CTX 36 (SUTURE) ×10
SUT VIC AB 0 CTX36XBRD ANBCTRL (SUTURE) ×5 IMPLANT
SUT VIC AB 2-0 SH 27 (SUTURE) ×2
SUT VIC AB 2-0 SH 27XBRD (SUTURE) ×1 IMPLANT
SUT VIC AB 4-0 KS 27 (SUTURE) ×3 IMPLANT
SYR 50ML LL SCALE MARK (SYRINGE) ×3 IMPLANT
TOWEL OR 17X24 6PK STRL BLUE (TOWEL DISPOSABLE) ×3 IMPLANT
TRAY FOLEY CATH 14FR (SET/KITS/TRAYS/PACK) IMPLANT
WATER STERILE IRR 1000ML UROMA (IV SOLUTION) ×3 IMPLANT

## 2015-01-01 NOTE — Anesthesia Preprocedure Evaluation (Signed)

## 2015-01-01 NOTE — Transfer of Care (Signed)
Immediate Anesthesia Transfer of Care Note  Patient: Lisa [name]  Procedure(s) Performed: Procedure(s): CESAREAN SECTION (N/A)  Patient Location: PACU  Anesthesia Type:Spinal  Level of Consciousness: awake, alert  and oriented  Airway & Oxygen Therapy: Patient Spontanous Breathing  Post-op Assessment: Report given to PACU RN and Post -op Vital signs reviewed and stable  Post vital signs: Reviewed and stable  Complications: No apparent anesthesia complications

## 2015-01-01 NOTE — Anesthesia Postprocedure Evaluation (Signed)
Anesthesia Post Note  Patient: [name]  Procedure(s) Performed: Procedure(s) (LRB): CESAREAN SECTION (N/A)  Anesthesia type: Spinal  Patient location: PACU  Post pain: Pain level controlled  Post assessment: Post-op Vital signs reviewed  Last Vitals:  Filed Vitals:   01/01/15 2357  BP:   Pulse: 73  Temp: 37.5 C  Resp: 18    Post vital signs: Reviewed  Level of consciousness: awake  Complications: No apparent anesthesia complications

## 2015-01-01 NOTE — Anesthesia Procedure Notes (Addendum)
Epidural Patient location during procedure: OB Start time: 01/01/2015 11:30 AM  Staffing Anesthesiologist: Brayton CavesJACKSON, Yakub Lodes Performed by: anesthesiologist   Preanesthetic Checklist Completed: patient identified, site marked, surgical consent, pre-op evaluation, timeout performed, IV checked, risks and benefits discussed and monitors and equipment checked  Epidural Patient position: sitting Prep: site prepped and draped and DuraPrep Patient monitoring: continuous pulse ox and blood pressure Approach: midline Location: L3-L4 Injection technique: LOR air  Needle:  Needle type: Tuohy  Needle gauge: 17 G Needle length: 9 cm and 9 Needle insertion depth: 5 cm cm Catheter type: closed end flexible Catheter size: 19 Gauge Catheter at skin depth: 10 cm Test dose: negative  Assessment Events: blood not aspirated, injection not painful, no injection resistance, negative IV test and no paresthesia  Additional Notes Patient identified.  Risk benefits discussed including failed block, incomplete pain control, headache, nerve damage, paralysis, blood pressure changes, nausea, vomiting, reactions to medication both toxic or allergic, and postpartum back pain.  Patient expressed understanding and wished to proceed.  All questions were answered.  Sterile technique used throughout procedure and epidural site dressed with sterile barrier dressing. No paresthesia or other complications noted.The patient did not experience any signs of intravascular injection such as tinnitus or metallic taste in mouth nor signs of intrathecal spread such as rapid motor block. Please see nursing notes for vital signs.   Spinal Patient location during procedure: OR Start time: 01/01/2015 8:58 PM Staffing Anesthesiologist: Brayton CavesJACKSON, Houa Ackert Performed by: anesthesiologist  Preanesthetic Checklist Completed: patient identified, site marked, surgical consent, pre-op evaluation, timeout performed, IV checked, risks and  benefits discussed and monitors and equipment checked Spinal Block Patient position: sitting Prep: DuraPrep Patient monitoring: heart rate, cardiac monitor, continuous pulse ox and blood pressure Approach: midline Location: L3-4 Injection technique: single-shot Needle Needle type: Sprotte  Needle gauge: 24 G Needle length: 9 cm Assessment Sensory level: T4 Additional Notes Patient identified.  Risk benefits discussed including failed block, incomplete pain control, headache, nerve damage, paralysis, blood pressure changes, nausea, vomiting, reactions to medication both toxic or allergic, and postpartum back pain.  Patient expressed understanding and wished to proceed.  All questions were answered.  Sterile technique used throughout procedure.  CSF was clear.  No parasthesia or other complications.  Please see nursing notes for vital signs.

## 2015-01-01 NOTE — Op Note (Signed)
Jolinda Daubert PROCEDURE DATE: 01/01/2015  PREOPERATIVE DIAGNOSIS: Intrauterine pregnancy at  9048w2d weeks gestation with falied trial of labor  POSTOPERATIVE DIAGNOSIS: The same  PROCEDURE:    Low Transverse Cesarean Section  SURGEON:  Dr. Elsie LincolnKelly Ardelle Haliburton  ASSISTANT: None  INDICATIONS: Verlan Friendsathaly Shelp is a 24 y.o. [id number] at 2848w2d with failed .  The risks of cesarean section discussed with the patient included but were not limited to: bleeding which may require transfusion or reoperation; infection which may require antibiotics; injury to bowel, bladder, ureters or other surrounding organs; injury to the fetus; need for additional procedures including hysterectomy in the event of a life-threatening hemorrhage; placental abnormalities wth subsequent pregnancies, incisional problems, thromboembolic phenomenon and other postoperative/anesthesia complications. The patient concurred with the proposed plan, giving informed written consent for the procedure.    FINDINGS:  Viable female  infant in cephalic presentation, 9,9 Apgars, 9 pounds 5 ounces, clear amniotic fluid.  Intact placenta, three vessel cord.  Grossly normal uterus, ovaries and fallopian tubes. .   ANESTHESIA:    spinal  ESTIMATED BLOOD LOSS: 1000 cc  SPECIMENS: Placenta sent to L&D  COMPLICATIONS: None immediate  PROCEDURE IN DETAIL:  The patient received intravenous antibiotics and had sequential compression devices applied to her lower extremities.  Spinal anesthesia was administered and was found to be adequate. She was then placed in a dorsal supine position with a leftward tilt, and prepped and draped in a sterile manner.  A foley catheter was placed into her bladder and attached to constant gravity.  After an adequate timeout was performed, a Pfannenstiel skin incision was made with scalpel and carried through to the underlying layer of fascia. The fascia was incised in the midline and this incision was extended bilaterally using the  Mayo scissors. Kocher clamps were applied to the superior aspect of the fascial incision and the underlying rectus muscles were dissected off bluntly. A similar process was carried out on the inferior aspect of the facial incision. The rectus muscles were separated in the midline bluntly and the peritoneum was entered bluntly.   The bladder was adherent to the LUS and was taken down by making a bladder flap.  A transverse hysterotomy was made with a scalpel and extended bilaterally bluntly. The bladder blade was then removed. The infant was successfully delivered, and cord was clamped and cut and infant was handed over to awaiting neonatology team. Uterine massage was then administered and the placenta delivered intact with three-vessel cord. The uterus was cleared of clot and debris.  The hysterotomy was closed with 0 vicryl.  A second imbricating suture of 0-Vicryl was used to reinforce the incision and aid in hemostasis.  There was a left extension of the uterine incision.  This was repaired with 0-Vicryl and 2-0 Monocryl.  Thre was also a 5 cm hematoma in the left broad ligament.  It was marked and watched for 10 minutes and noted to be stable.    Given the dissection near the bladder, the bladder was back filled with sterile milk and there was no extravasation of milk into the peritoneum.  The peritoneum and rectus muscles were noted to be hemostatic.  The fascia was closed with 0-Vicryl in a running fashion with good restoration of anatomy.  The subcutaneus tissue was copiously irrigated.  The skin was closed with 4-0 Vicryl in a subcuticular fashion.    Pt tolerated the procedure will.  All counts were correct x2.  Pt went to the recovery room in stable  condition.

## 2015-01-01 NOTE — Progress Notes (Signed)
Patient ID: Verlan [name], female   DOB: 04/01/1991, 24 y.o.   MRN: [id number]   Pt pushed for approximately 1 hour.  Baby was OT and manully rotated to OA.  Pt pushed and has not descended.  On recheck head is bak in OT and the caput has increased.  Pt very tired and has concerns she has had very slow progression compared to her last vaginal delivery.  Pt was told she can continue to push or is she wished to not continue, then she could elect for a c/s.    Pt elects for rpt c/s The risks of cesarean section discussed with the patient included but were not limited to: bleeding which may require transfusion or reoperation; infection which may require antibiotics; injury to bowel, bladder, ureters or other surrounding organs; injury to the fetus; need for additional procedures including hysterectomy in the event of a life-threatening hemorrhage; placental abnormalities wth subsequent pregnancies, incisional problems, thromboembolic phenomenon and other postoperative/anesthesia complications. The patient concurred with the proposed plan, giving informed written consent for the procedure.

## 2015-01-01 NOTE — H&P (Signed)
[name] is a 24 y.o. female [id number] at 6534w2d presenting for contractions Denies leakage of fluid, vaginal bleeding, s/s pre-eclampsia.  [facility] 8 [facility], and has been remarkable for previous c/s x 1 followed by successful VBAC.   Maternal Medical History:  Reason for admission: Contractions.   Contractions: Onset was 6-12 hours ago.   Frequency: regular.    Prenatal Complications - Diabetes: none.    OB History    Gravida Para Term Preterm AB TAB SAB Ectopic Multiple Living   3 2 2   0     2     Past Medical History  Diagnosis Date  . Medical history non-contributory    Past Surgical History  Procedure Laterality Date  . Cesarean section  06/22/2008   Family History: family history includes Diabetes in her maternal grandmother; Hypertension in her maternal grandmother; Miscarriages / [place] in her mother; Stroke in her maternal grandmother. Social History:  reports that she has quit smoking. Her smoking use included Cigarettes. She smoked 0.00 packs per day. She has never used smokeless tobacco. She reports that she does not drink alcohol or use illicit drugs.   Prenatal Transfer Tool  Maternal Diabetes: No Genetic Screening: Normal Maternal Ultrasounds/Referrals: Normal Fetal Ultrasounds or other Referrals:  None Maternal Substance Abuse:  No Significant Maternal Medications:  None Significant Maternal Lab Results:  Lab values include: Group B Strep negative Other Comments:  None  ROS  Dilation: 5 Effacement (%): 80 Station: -2 Exam by:: TEPPCO PartnersDenise Collison RN Blood pressure 121/82, pulse 86, temperature 98.4 F (36.9 C), temperature source Oral, resp. rate 18, height 4\' 11"  (1.499 m), weight 78.472 kg (173 lb), last menstrual period 11/29/2013. Exam Physical Exam  Constitutional: She is oriented to person, place, and time. She appears well-developed and well-nourished.  HENT:  Head: Normocephalic.   Cardiovascular: Normal rate.   Respiratory: Effort normal.  GI:  EFM 125 baseline, + accels, no decels, Category 1 Moderate contractions every 2-3 minutes  Musculoskeletal: Normal range of motion. She exhibits no edema.  Neurological: She is alert and oriented to person, place, and time.  Skin: Skin is warm and dry.  Psychiatric: She has a normal mood and affect. Her behavior is normal. Judgment and thought content normal.    Prenatal labs: ABO, Rh: B/POS/-- (06/17 1459) Antibody: NEG (10/21 0902) Rubella: 1.90 (06/17 1459) RPR: NON REAC (10/21 0902)  HBsAg: NEGATIVE (06/17 1459)  HIV: NONREACTIVE (10/21 0902)  GBS: NOT DETECTED (12/23 0945)   Assessment/Plan: IUP at 2934w2d, active labor Desires TOLAC, consent on chart Progressing normally. Reassuring FHR IV pain management Anticipate SVD   SHAW, KIMBERLY 01/01/2015, 5:10 AM

## 2015-01-01 NOTE — Progress Notes (Addendum)
[name] is a 24 y.o. G3P2002 at 7472w2d.  Subjective: Still having hip pain after epidural. No pain near incision.   Objective: BP 88/38 mmHg  Pulse 78  Temp(Src) 98.1 F (36.7 C) (Oral)  Resp 18  Ht 4\' 11"  (1.499 m)  Wt 78.472 kg (173 lb)  BMI 34.92 kg/m2  SpO2 99%  LMP 11/29/2013   FHT:  FHR: 125 bpm, variability: moderate,  accelerations:  15x15,  decelerations:  none UC:   Q 2-283minutes, strong   Dilation: 8 Effacement (%): 90 Cervical Position:  (left) Station: -2 Presentation: Vertex IUPC placed w/out difficulty [place] Rafay Ortiz, CNM   Labs: Results for orders placed or performed during the hospital encounter of 01/01/15 (from the past 24 hour(s))  Type and screen     Status: None   Collection Time: 01/01/15  3:45 AM  Result Value Ref Range   ABO/RH(D) B POS    Antibody Screen NEG    Sample Expiration 01/04/2015   CBC     Status: Abnormal   Collection Time: 01/01/15  3:45 AM  Result Value Ref Range   WBC 11.3 (H) 4.0 - 10.5 K/uL   RBC 4.00 3.87 - 5.11 MIL/uL   Hemoglobin 10.0 (L) 12.0 - 15.0 g/dL   HCT 16.132.0 (L) 09.636.0 - 04.546.0 %   MCV 80.0 78.0 - 100.0 fL   MCH 25.0 (L) 26.0 - 34.0 pg   MCHC 31.3 30.0 - 36.0 g/dL   RDW 40.915.8 (H) 81.111.5 - 91.415.5 %   Platelets 218 150 - 400 K/uL  RPR     Status: None   Collection Time: 01/01/15  3:45 AM  Result Value Ref Range   RPR NON REAC NON REAC  ABO/Rh     Status: None   Collection Time: 01/01/15  3:45 AM  Result Value Ref Range   ABO/RH(D) B POS     Assessment / Plan: 5172w2d week IUP Labor: Active, TOLAC Fetal Wellbeing:  Category I Pain Control:  Epidural Anticipated MOD:  SVD Monitor pain closely. Redose epidural PRN.   [place] Kil[place] Ortiz, CNM 01/01/2015 12:19 PM

## 2015-01-01 NOTE — MAU Note (Signed)
Pt to be admitted to L&D.

## 2015-01-01 NOTE — MAU Note (Signed)
Pt states that she began contracting 3 hours ago. Pt denies leaking of fluid and bleeding, baby is active.

## 2015-01-01 NOTE — Progress Notes (Signed)
Pt continues to c/o right hip pain with contractions. States the pain does feel better when she pushes her belly into the bed with a contraction. C/O back pain as well. Will continue to monitor. CNM made aware. Will get anesthesia to redose patient

## 2015-01-01 NOTE — Plan of Care (Signed)
Problem: Consults Goal: Birthing Suites Patient Information Press F2 to bring up selections list Outcome: Completed/Met Date Met:  01/01/15  Pt 37-[redacted] weeks EGA and Other (specify with a note) TOLAC with previous successful VBAC

## 2015-01-02 ENCOUNTER — Encounter (HOSPITAL_COMMUNITY): Payer: Self-pay | Admitting: *Deleted

## 2015-01-02 ENCOUNTER — Other Ambulatory Visit: Payer: Medicaid Other

## 2015-01-02 LAB — CBC
HCT: 28.3 % — ABNORMAL LOW (ref 36.0–46.0)
HCT: 31.8 % — ABNORMAL LOW (ref 36.0–46.0)
HEMOGLOBIN: 10 g/dL — AB (ref 12.0–15.0)
HEMOGLOBIN: 8.9 g/dL — AB (ref 12.0–15.0)
MCH: 25.1 pg — ABNORMAL LOW (ref 26.0–34.0)
MCH: 25.1 pg — ABNORMAL LOW (ref 26.0–34.0)
MCHC: 31.4 g/dL (ref 30.0–36.0)
MCHC: 31.4 g/dL (ref 30.0–36.0)
MCV: 79.9 fL (ref 78.0–100.0)
MCV: 79.9 fL (ref 78.0–100.0)
PLATELETS: 215 10*3/uL (ref 150–400)
Platelets: 218 10*3/uL (ref 150–400)
RBC: 3.54 MIL/uL — AB (ref 3.87–5.11)
RBC: 3.98 MIL/uL (ref 3.87–5.11)
RDW: 16 % — AB (ref 11.5–15.5)
RDW: 16 % — ABNORMAL HIGH (ref 11.5–15.5)
WBC: 18.8 10*3/uL — ABNORMAL HIGH (ref 4.0–10.5)
WBC: 21.3 10*3/uL — ABNORMAL HIGH (ref 4.0–10.5)

## 2015-01-02 MED ORDER — SIMETHICONE 80 MG PO CHEW
80.0000 mg | CHEWABLE_TABLET | ORAL | Status: DC | PRN
  Administered 2015-01-02 – 2015-01-04 (×2): 80 mg via ORAL
  Filled 2015-01-02: qty 1

## 2015-01-02 MED ORDER — OXYCODONE-ACETAMINOPHEN 5-325 MG PO TABS
2.0000 | ORAL_TABLET | ORAL | Status: DC | PRN
  Administered 2015-01-03 – 2015-01-04 (×2): 2 via ORAL
  Filled 2015-01-02 (×2): qty 2

## 2015-01-02 MED ORDER — ONDANSETRON HCL 4 MG/2ML IJ SOLN: 4.0000 mg | INTRAMUSCULAR | Status: DC | PRN

## 2015-01-02 MED ORDER — PRENATAL MULTIVITAMIN CH
1.0000 | ORAL_TABLET | Freq: Every day | ORAL | Status: DC
  Administered 2015-01-02 – 2015-01-04 (×3): 1 via ORAL
  Filled 2015-01-02 (×3): qty 1

## 2015-01-02 MED ORDER — LANOLIN HYDROUS EX OINT: 1.0000 "application " | TOPICAL_OINTMENT | CUTANEOUS | Status: DC | PRN

## 2015-01-02 MED ORDER — MENTHOL 3 MG MT LOZG: 1.0000 | LOZENGE | OROMUCOSAL | Status: DC | PRN

## 2015-01-02 MED ORDER — ACETAMINOPHEN 500 MG PO TABS: 1000.0000 mg | ORAL_TABLET | Freq: Four times a day (QID) | ORAL | Status: DC

## 2015-01-02 MED ORDER — HYDROMORPHONE HCL 1 MG/ML IJ SOLN
INTRAMUSCULAR | Status: AC
  Filled 2015-01-02: qty 2

## 2015-01-02 MED ORDER — IBUPROFEN 600 MG PO TABS
600.0000 mg | ORAL_TABLET | Freq: Four times a day (QID) | ORAL | Status: DC
  Administered 2015-01-02 – 2015-01-04 (×10): 600 mg via ORAL
  Filled 2015-01-02 (×9): qty 1

## 2015-01-02 MED ORDER — ONDANSETRON HCL 4 MG PO TABS: 4.0000 mg | ORAL_TABLET | ORAL | Status: DC | PRN

## 2015-01-02 MED ORDER — TETANUS-DIPHTH-ACELL PERTUSSIS 5-2.5-18.5 LF-MCG/0.5 IM SUSP: 0.5000 mL | Freq: Once | INTRAMUSCULAR | Status: DC

## 2015-01-02 MED ORDER — MEASLES, MUMPS & RUBELLA VAC ~~LOC~~ INJ
0.5000 mL | INJECTION | Freq: Once | SUBCUTANEOUS | Status: DC
  Filled 2015-01-02: qty 0.5

## 2015-01-02 MED ORDER — DIPHENHYDRAMINE HCL 25 MG PO CAPS
25.0000 mg | ORAL_CAPSULE | ORAL | Status: DC | PRN
Start: 2015-01-02 — End: 2015-01-02

## 2015-01-02 MED ORDER — WITCH HAZEL-GLYCERIN EX PADS
1.0000 | MEDICATED_PAD | CUTANEOUS | Status: DC | PRN
Start: 2015-01-02 — End: 2015-01-04

## 2015-01-02 MED ORDER — NALOXONE HCL 0.4 MG/ML IJ SOLN: 0.4000 mg | INTRAMUSCULAR | Status: DC | PRN

## 2015-01-02 MED ORDER — NALBUPHINE HCL 10 MG/ML IJ SOLN: 5.0000 mg | Freq: Once | INTRAMUSCULAR | Status: DC | PRN

## 2015-01-02 MED ORDER — HYDROMORPHONE HCL 1 MG/ML IJ SOLN
2.0000 mg | Freq: Once | INTRAMUSCULAR | Status: AC
Start: 2015-01-02 — End: 2015-01-02
  Administered 2015-01-02: 2 mg via INTRAVENOUS

## 2015-01-02 MED ORDER — DIBUCAINE 1 % RE OINT: 1.0000 "application " | TOPICAL_OINTMENT | RECTAL | Status: DC | PRN

## 2015-01-02 MED ORDER — NALOXONE HCL 1 MG/ML IJ SOLN
1.0000 ug/kg/h | INTRAMUSCULAR | Status: DC | PRN
  Filled 2015-01-02: qty 2

## 2015-01-02 MED ORDER — OXYCODONE-ACETAMINOPHEN 5-325 MG PO TABS
1.0000 | ORAL_TABLET | ORAL | Status: DC | PRN
  Administered 2015-01-02 – 2015-01-03 (×2): 1 via ORAL
  Filled 2015-01-02 (×2): qty 1

## 2015-01-02 MED ORDER — SIMETHICONE 80 MG PO CHEW
80.0000 mg | CHEWABLE_TABLET | ORAL | Status: DC
  Administered 2015-01-03 – 2015-01-04 (×2): 80 mg via ORAL
  Filled 2015-01-02: qty 1

## 2015-01-02 MED ORDER — SENNOSIDES-DOCUSATE SODIUM 8.6-50 MG PO TABS
2.0000 | ORAL_TABLET | ORAL | Status: DC
  Administered 2015-01-03 – 2015-01-04 (×2): 2 via ORAL
  Filled 2015-01-02: qty 2

## 2015-01-02 MED ORDER — DIPHENHYDRAMINE HCL 25 MG PO CAPS: 25.0000 mg | ORAL_CAPSULE | Freq: Four times a day (QID) | ORAL | Status: DC | PRN

## 2015-01-02 MED ORDER — ONDANSETRON HCL 4 MG/2ML IJ SOLN: 4.0000 mg | Freq: Three times a day (TID) | INTRAMUSCULAR | Status: DC | PRN

## 2015-01-02 MED ORDER — LACTATED RINGERS IV SOLN
INTRAVENOUS | Status: DC
  Administered 2015-01-02 (×2): via INTRAVENOUS

## 2015-01-02 MED ORDER — DIPHENHYDRAMINE HCL 50 MG/ML IJ SOLN: 12.5000 mg | INTRAMUSCULAR | Status: DC | PRN

## 2015-01-02 MED ORDER — OXYTOCIN 40 UNITS IN LACTATED RINGERS INFUSION - SIMPLE MED: 62.5000 mL/h | INTRAVENOUS | Status: AC

## 2015-01-02 MED ORDER — SODIUM CHLORIDE 0.9 % IJ SOLN: 3.0000 mL | INTRAMUSCULAR | Status: DC | PRN

## 2015-01-02 MED ORDER — IBUPROFEN 600 MG PO TABS: 600.0000 mg | ORAL_TABLET | Freq: Four times a day (QID) | ORAL | Status: DC | PRN

## 2015-01-02 MED ORDER — NALBUPHINE HCL 10 MG/ML IJ SOLN: 5.0000 mg | INTRAMUSCULAR | Status: DC | PRN

## 2015-01-02 NOTE — Anesthesia Postprocedure Evaluation (Signed)
  Anesthesia Post-op Note  Anesthesia Post Note  Patient: [name]  Procedure(s) Performed: Procedure(s) (LRB): CESAREAN SECTION (N/A)  Anesthesia type: Spinal  Patient location: Mother/Baby  Post pain: Pain level controlled  Post assessment: Post-op Vital signs reviewed  Last Vitals:  Filed Vitals:   01/02/15 0745  BP: 105/58  Pulse: 87  Temp: 37.3 C  Resp: 17    Post vital signs: Reviewed  Level of consciousness: awake  Complications: No apparent anesthesia complications

## 2015-01-02 NOTE — Lactation Note (Signed)
This note was copied from the chart of Lisa Lendora Troup. Lactation Consultation Note  P3, BF first child approx 3 weeks and 2nd child 2 months.  Was supplementing w/formula and milk supply decreeased. Hopes to bf longer with this child. Baby sleeping in crib.  Mother states during one feeding she had nipple pain.  Discussed how to achieve a deeper latch. Encouraged her to call for assistance with future feeding to check latch. Provided mother w/ comfort gels and encouraged her to apply ebm. Mom encouraged to feed baby 8-12 times/24 hours and with feeding cues.  Mom made aware of O/P services, breastfeeding support groups, community resources, and our phone # for post-discharge questions.       Patient Name: [name] ZOXWR'UToday's Date: 01/02/2015 Reason for consult: Initial assessment   Maternal Data Has patient been taught Hand Expression?: Yes Does the patient have breastfeeding experience prior to this delivery?: Yes  Feeding Feeding Type: Breast Fed Length of feed: 30 min  LATCH Score/Interventions                      Lactation Tools Discussed/Used     Consult Status Consult Status: Follow-up Date: 01/03/15 Follow-up type: In-patient    Dahlia ByesBerkelhammer, Ruth [facility] 01/02/2015, 10:08 AM

## 2015-01-02 NOTE — Anesthesia Postprocedure Evaluation (Signed)
  Anesthesia Post-op Note  Anesthesia Post Note  Patient: Lisa [name]  Procedure(s) Performed: Procedure(s) (LRB): CESAREAN SECTION (N/A)  Anesthesia type: Epidural  Patient location: Mother/Baby  Post pain: Pain level controlled  Post assessment: Post-op Vital signs reviewed  Last Vitals:  Filed Vitals:   01/02/15 0745  BP: 105/58  Pulse: 87  Temp: 37.3 C  Resp: 17    Post vital signs: Reviewed  Level of consciousness:alert  Complications: No apparent anesthesia complications

## 2015-01-02 NOTE — Addendum Note (Signed)
Addendum  created 01/02/15 0819 by Turner DanielsJennifer L Ferdinand Revoir, CRNA   Modules edited: Notes Section   Notes Section:  File: [id number]; File: [id number]

## 2015-01-02 NOTE — Progress Notes (Signed)
Subjective: Postpartum Day 1: Cesarean Delivery Patient reports tolerating PO, ambulating, has catheter placed. No bowel movement. Initially some pain at left side of inscision; however pain has gone away.   Objective: Vital signs in last 24 hours: Temp:  [98.1 F (36.7 C)-99.5 F (37.5 C)] 99.4 F (37.4 C) (01/08 0500) Pulse Rate:  [67-124] 81 (01/08 0500) Resp:  [18-28] 18 (01/08 0500) BP: (88-127)/(38-75) 97/61 mmHg (01/08 0500) SpO2:  [91 %-100 %] 96 % (01/08 0500)  Physical Exam:  General: alert, cooperative, appears stated age and no distress. Abdomen:Tenderness of RLQ on palpation. Lochia: appropriate Uterine Fundus: firm Incision: no significant drainage, no significant erythema.  DVT Evaluation: Positive Homan's sign left calf - pain elicited in upper posterior calf. Left upper posterior calf tenderness present on palpation. Has SCDs placed  b/l.    Recent Labs  01/02/15 0020 01/02/15 0550  HGB 10.0* 8.9*  HCT 31.8* 28.3*    Assessment/Plan: Status post Cesarean section. Doing well postoperatively. Continue current care. Of note, left posterior calf tenderness. Follow up.   Arvilla MeresMeyer, Ashley L PA-S 01/02/2015, 7:46 AM

## 2015-01-03 LAB — BIRTH TISSUE RECOVERY COLLECTION (PLACENTA DONATION)

## 2015-01-03 MED ORDER — OXYCODONE-ACETAMINOPHEN 5-325 MG PO TABS: 1.0000 | ORAL_TABLET | ORAL | Status: DC | PRN

## 2015-01-03 MED ORDER — FERROUS SULFATE 325 (65 FE) MG PO TABS: 325.0000 mg | ORAL_TABLET | Freq: Two times a day (BID) | ORAL | Status: DC

## 2015-01-03 MED ORDER — IBUPROFEN 600 MG PO TABS: 600.0000 mg | ORAL_TABLET | Freq: Four times a day (QID) | ORAL | Status: DC | PRN

## 2015-01-03 MED ORDER — NORETHINDRONE 0.35 MG PO TABS: 1.0000 | ORAL_TABLET | Freq: Every day | ORAL | Status: DC

## 2015-01-03 NOTE — Lactation Note (Signed)
This note was copied from the chart of Boy Lorene Spohr. Lactation Consultation Note  Patient Name: Boy Verlan Friendsathaly Robarts QIONG'EToday's Date: 01/03/2015  RN reports she assisted Mom with obtaining more depth when latching baby. RN gave Mom comfort gels for nipple tenderness.    Maternal Data    Feeding Feeding Type: Breast Fed  LATCH Score/Interventions Latch: Grasps breast easily, tongue down, lips flanged, rhythmical sucking.  Audible Swallowing: A few with stimulation Intervention(s): Skin to skin  Type of Nipple: Everted at rest and after stimulation Intervention(s): No intervention needed  Comfort (Breast/Nipple): Filling, red/small blisters or bruises, mild/mod discomfort  Problem noted: Mild/Moderate discomfort Interventions (Mild/moderate discomfort): Hand expression;Comfort gels  Hold (Positioning): No assistance needed to correctly position infant at breast. Intervention(s): Skin to skin;Position options;Breastfeeding basics reviewed;Support Pillows  LATCH Score: 8  Lactation Tools Discussed/Used     Consult Status      Alfred LevinsGranger, [place] 01/03/2015, 11:29 PM

## 2015-01-03 NOTE — Discharge Instructions (Signed)

## 2015-01-03 NOTE — Discharge Summary (Signed)
Obstetric Discharge Summary Reason for Admission: onset of labor- prev C/S and prev VBAC with desire for TOLAC Prenatal Procedures: none Intrapartum Procedures: cesarean: low cervical, transverse Postpartum Procedures: none Complications-Operative and Postpartum: none HEMOGLOBIN  Date Value Ref Range Status  01/02/2015 8.9* 12.0 - 15.0 g/dL Final   HCT  Date Value Ref Range Status  01/02/2015 28.3* 36.0 - 46.0 % Final   Ms Eliezer LoftsLoza is a 24yo G3P2002 who presents in active labor on 1/7 @ 39.2wks with the desire for a TOLAC.  She progressed through labor and became complete and pushed x 1hr before becoming exhausted and discouraged re the lengthiness of this 2nd stage as compared to her VBAC. Options were rev'd with her and she elected a rLTCS. By POD#2 she is doing well and is deemed ready for discharge. She is breastfeeding and desires Micronor for contraception.  Physical Exam:  General: alert, cooperative and no distress  Lungs: nl effort Heart: RRR Lochia: appropriate Uterine Fundus: firm Incision: honeycomb intact; marked and unchanged DVT Evaluation: No evidence of DVT seen on physical exam.  Discharge Diagnoses: Term Pregnancy-delivered  Discharge Information: Date: 01/03/2015 Activity: pelvic rest Diet: routine Medications: PNV, Ibuprofen, Percocet and Micronor to start Jan 24 Condition: stable Instructions: refer to practice specific booklet Discharge to: home Follow-up Information    Follow up with [facility]. Schedule an appointment as soon as possible for a visit in 1 week.   Why:  For your incision check. Then you will need a 6 wk postpartum visit.   Contact information:   [address] [place] [phone]      Newborn Data: Live born female  Birth Weight: 9 lb 4.7 oz (4216 g) APGAR: 9, 9  Home with mother.  Ryan B. Jarvis NewcomerGrunz, MD, PGY-2 01/04/2015 8:56 AM

## 2015-01-04 NOTE — Lactation Note (Signed)
This note was copied from the chart of [name]. Lactation Consultation Note  Patient Name: [name] ZOXWR'UToday's Date: 01/04/2015 Reason for consult: Follow-up assessment;Infant weight loss  Baby is 60 hours old @ 10 % weight loss , and mom has been breast feeding and supplementing. With formula from a bottle. Per mom baby has  Been taking it well.  LC discussed with mom supply and demand and the importance of breast feeding 1st and then supplementing. Per mom has a history of challenges with milk supply , as of now plans to go buy a DEBP . LC suggested post pumping  10 -15 mins after at least 4 feedings and when necessary, save milk and supplement back to baby. LC instructed mom on the use of  Hand pump and increased flange #27 . LC assessed breast tissue with moms permission , both nipples scabbed , per mom tender ,  Had been given comfort gels , but hasn't used them yet. LC instructed mom on the use shells also. Sore nipple and engorgement prevention and tx reviewed.  LC recommended if sore nipples don't clear by 4 days [facility]. Mother informed of post-discharge support and given phone number to the lactation department, including services for phone call assistance; out-patient appointments; and breastfeeding support group. List of other breastfeeding resources in the community given in the handout. Encouraged mother to call for problems or concerns related to breastfeeding.   Maternal Data Has patient been taught Hand Expression?: Yes  Feeding Feeding Type:  (per mom recently breast fed and supplemented after with formula ) Length of feed: 10 min  LATCH Score/Interventions                Intervention(s): Breastfeeding basics reviewed     Lactation Tools Discussed/Used Tools: Shells;Pump;Comfort gels;Flanges Flange Size: 27 Shell Type: Inverted Breast pump type: Manual WIC Program: No (per mom ) Pump Review: Setup, frequency, and  cleaning Initiated by:: MAI  Date initiated:: 01/04/15   Consult Status Consult Status: Complete Date: 01/04/15    Lisa Greathouseorio, [name] Ann 01/04/2015, 10:10 AM

## 2015-01-05 ENCOUNTER — Encounter (HOSPITAL_COMMUNITY): Payer: Self-pay | Admitting: Obstetrics & Gynecology

## 2015-01-07 ENCOUNTER — Encounter: Payer: Medicaid Other | Admitting: Advanced Practice Midwife

## 2015-01-08 ENCOUNTER — Encounter: Payer: Medicaid Other | Admitting: Obstetrics & Gynecology

## 2015-01-08 ENCOUNTER — Encounter: Payer: Self-pay | Admitting: Obstetrics & Gynecology

## 2015-01-16 ENCOUNTER — Encounter: Payer: Medicaid Other | Admitting: Obstetrics & Gynecology

## 2015-01-19 ENCOUNTER — Encounter: Payer: Medicaid Other | Admitting: Obstetrics & Gynecology

## 2018-07-28 DIAGNOSIS — O200 Threatened abortion: Secondary | ICD-10-CM | POA: Diagnosis not present

## 2018-07-28 DIAGNOSIS — Z3A22 22 weeks gestation of pregnancy: Secondary | ICD-10-CM | POA: Diagnosis not present

## 2018-08-01 DIAGNOSIS — O200 Threatened abortion: Secondary | ICD-10-CM | POA: Diagnosis not present

## 2018-08-02 ENCOUNTER — Telehealth: Payer: Self-pay | Admitting: Women's Health

## 2018-08-02 ENCOUNTER — Telehealth: Payer: Self-pay | Admitting: *Deleted

## 2018-08-02 NOTE — Telephone Encounter (Signed)
Pt called stating that she went to [facility] [place]. She was told that she had a "threatened miscarriage". She states that blood work was done but no one would tell her what the results meant. She was told she would have to follow up with an OB/GYN. She states that she has her records from there. Advised to bring the records by the office tomorrow for us to review and to make an appt with a provider for follow up from this. Pt verbalized understanding.

## 2018-08-02 NOTE — Telephone Encounter (Signed)
Please call pt she was on vacation went to er for bleeding and cramping/ they did us and labs told her she was early preg on having possible miscarriage. They will not give her the results of labs told her she needed to make appt / she is on her way home today/ going to stop by er and get records. I am really confused not sure what she needs? WE have not seen pt since 2016 so she will be new and I did not have any new pt appts/ not sure if we need to just repeat her labs? Pt is still bleeding. Please call pt to discuss

## 2018-08-03 ENCOUNTER — Telehealth: Payer: Self-pay | Admitting: *Deleted

## 2018-08-03 DIAGNOSIS — O039 Complete or unspecified spontaneous abortion without complication: Secondary | ICD-10-CM

## 2018-08-03 NOTE — Telephone Encounter (Signed)
Informed pt that after reviewing her chart from [facility], she would need to come in on Monday to have another Hcg level drawn. Advised that she did not need to see a provider at this point unless she had any other issues arise. Pt verbalized understanding.

## 2018-08-04 ENCOUNTER — Emergency Department (HOSPITAL_COMMUNITY): Payer: Medicaid Other | Admitting: Anesthesiology

## 2018-08-04 ENCOUNTER — Encounter: Payer: Self-pay | Admitting: Obstetrics and Gynecology

## 2018-08-04 ENCOUNTER — Emergency Department (HOSPITAL_COMMUNITY): Payer: Medicaid Other

## 2018-08-04 ENCOUNTER — Other Ambulatory Visit: Payer: Self-pay | Admitting: Obstetrics and Gynecology

## 2018-08-04 ENCOUNTER — Encounter (HOSPITAL_COMMUNITY): Payer: Self-pay | Admitting: Emergency Medicine

## 2018-08-04 ENCOUNTER — Other Ambulatory Visit: Payer: Self-pay

## 2018-08-04 ENCOUNTER — Emergency Department (HOSPITAL_COMMUNITY)
Admission: EM | Admit: 2018-08-04 | Discharge: 2018-08-04 | Disposition: A | Discharge status: Home / Self Care | Payer: Medicaid Other | Attending: Emergency Medicine | Admitting: Emergency Medicine

## 2018-08-04 ENCOUNTER — Encounter (HOSPITAL_COMMUNITY)
Admission: EM | Disposition: A | Discharge status: Home / Self Care | Payer: Self-pay | Source: Home / Self Care | Attending: Emergency Medicine

## 2018-08-04 DIAGNOSIS — Z87891 Personal history of nicotine dependence: Secondary | ICD-10-CM | POA: Diagnosis not present

## 2018-08-04 DIAGNOSIS — O009 Unspecified ectopic pregnancy without intrauterine pregnancy: Secondary | ICD-10-CM

## 2018-08-04 DIAGNOSIS — N3289 Other specified disorders of bladder: Secondary | ICD-10-CM

## 2018-08-04 DIAGNOSIS — O00101 Right tubal pregnancy without intrauterine pregnancy: Secondary | ICD-10-CM | POA: Insufficient documentation

## 2018-08-04 HISTORY — PX: LAPAROSCOPIC UNILATERAL SALPINGECTOMY: SHX5934

## 2018-08-04 LAB — COMPREHENSIVE METABOLIC PANEL
ALT: 21 U/L (ref 0–44)
AST: 22 U/L (ref 15–41)
Albumin: 4.5 g/dL (ref 3.5–5.0)
Alkaline Phosphatase: 49 U/L (ref 38–126)
Anion gap: 6 (ref 5–15)
BUN: 12 mg/dL (ref 6–20)
CALCIUM: 8.7 mg/dL — AB (ref 8.9–10.3)
CHLORIDE: 105 mmol/L (ref 98–111)
CO2: 26 mmol/L (ref 22–32)
CREATININE: 0.54 mg/dL (ref 0.44–1.00)
GFR calc non Af Amer: >60 mL/min (ref >60)
Glucose, Bld: 97 mg/dL (ref 70–99)
Potassium: 3.9 mmol/L (ref 3.5–5.1)
Sodium: 137 mmol/L (ref 135–145)
Total Bilirubin: 0.4 mg/dL (ref 0.3–1.2)
Total Protein: 8.3 g/dL — ABNORMAL HIGH (ref 6.5–8.1)

## 2018-08-04 LAB — CBC WITH DIFFERENTIAL/PLATELET
Basophils Absolute: 0 10*3/uL (ref 0.0–0.1)
Basophils Relative: 0 %
Eosinophils Absolute: 0.1 10*3/uL (ref 0.0–0.7)
Eosinophils Relative: 1 %
HCT: 40.8 % (ref 36.0–46.0)
HEMOGLOBIN: 13.8 g/dL (ref 12.0–15.0)
Lymphocytes Relative: 24 %
Lymphs Abs: 1.6 10*3/uL (ref 0.7–4.0)
MCH: 30.8 pg (ref 26.0–34.0)
MCHC: 33.8 g/dL (ref 30.0–36.0)
MCV: 91.1 fL (ref 78.0–100.0)
Monocytes Absolute: 0.4 10*3/uL (ref 0.1–1.0)
Monocytes Relative: 6 %
NEUTROS ABS: 4.5 10*3/uL (ref 1.7–7.7)
NEUTROS PCT: 69 %
Platelets: 248 10*3/uL (ref 150–400)
RBC: 4.48 MIL/uL (ref 3.87–5.11)
RDW: 12.6 % (ref 11.5–15.5)
WBC: 6.6 10*3/uL (ref 4.0–10.5)

## 2018-08-04 LAB — URINALYSIS, ROUTINE W REFLEX MICROSCOPIC
BACTERIA UA: NONE SEEN
Bilirubin Urine: NEGATIVE
Glucose, UA: NEGATIVE mg/dL
Ketones, ur: NEGATIVE mg/dL
LEUKOCYTES UA: NEGATIVE
Nitrite: NEGATIVE
Protein, ur: NEGATIVE mg/dL
SPECIFIC GRAVITY, URINE: 1.024 (ref 1.005–1.030)
pH: 7 (ref 5.0–8.0)

## 2018-08-04 LAB — ABO/RH: ABO/RH(D): B POS

## 2018-08-04 LAB — POC URINE PREG, ED: PREG TEST UR: POSITIVE — AB

## 2018-08-04 LAB — HCG, QUANTITATIVE, PREGNANCY: HCG, BETA CHAIN, QUANT, S: 1217 m[IU]/mL — AB (ref <5)

## 2018-08-04 SURGERY — LAPAROSCOPY, WITH ECTOPIC PREGNANCY SURGICAL TREATMENT
Anesthesia: General | Laterality: Right

## 2018-08-04 SURGERY — SALPINGECTOMY, UNILATERAL, LAPAROSCOPIC
Anesthesia: General | Laterality: Right

## 2018-08-04 MED ORDER — ROCURONIUM BROMIDE 50 MG/5ML IV SOLN
INTRAVENOUS | Status: AC
  Filled 2018-08-04: qty 2

## 2018-08-04 MED ORDER — IBUPROFEN 600 MG PO TABS: 600.0000 mg | ORAL_TABLET | Freq: Four times a day (QID) | ORAL | 1 refills | Status: DC | PRN

## 2018-08-04 MED ORDER — KETOROLAC TROMETHAMINE 30 MG/ML IJ SOLN
INTRAMUSCULAR | Status: DC | PRN
  Administered 2018-08-04: 30 mg via INTRAVENOUS

## 2018-08-04 MED ORDER — BUPIVACAINE-EPINEPHRINE (PF) 0.5% -1:200000 IJ SOLN
INTRAMUSCULAR | Status: AC
  Filled 2018-08-04: qty 30

## 2018-08-04 MED ORDER — SUGAMMADEX SODIUM 500 MG/5ML IV SOLN
INTRAVENOUS | Status: DC | PRN
  Administered 2018-08-04: 114.4 mg via INTRAVENOUS

## 2018-08-04 MED ORDER — SODIUM CHLORIDE 0.9 % IV BOLUS
500.0000 mL | Freq: Once | INTRAVENOUS | Status: AC
  Administered 2018-08-04: 500 mL via INTRAVENOUS

## 2018-08-04 MED ORDER — MIDAZOLAM HCL 2 MG/2ML IJ SOLN
INTRAMUSCULAR | Status: DC | PRN
  Administered 2018-08-04: 1 mg via INTRAVENOUS

## 2018-08-04 MED ORDER — FENTANYL CITRATE (PF) 100 MCG/2ML IJ SOLN
INTRAMUSCULAR | Status: DC | PRN
  Administered 2018-08-04 (×2): 100 ug via INTRAVENOUS
  Administered 2018-08-04 (×2): 25 ug via INTRAVENOUS

## 2018-08-04 MED ORDER — ROCURONIUM BROMIDE 100 MG/10ML IV SOLN
INTRAVENOUS | Status: DC | PRN
  Administered 2018-08-04: 50 mg via INTRAVENOUS

## 2018-08-04 MED ORDER — HYDROCODONE-ACETAMINOPHEN 5-325 MG PO TABS: 1.0000 | ORAL_TABLET | Freq: Four times a day (QID) | ORAL | 0 refills | Status: DC | PRN

## 2018-08-04 MED ORDER — PROPOFOL 10 MG/ML IV BOLUS
INTRAVENOUS | Status: DC | PRN
  Administered 2018-08-04: 50 mg via INTRAVENOUS
  Administered 2018-08-04: 150 mg via INTRAVENOUS
  Administered 2018-08-04: 50 mg via INTRAVENOUS

## 2018-08-04 MED ORDER — SODIUM CHLORIDE 0.9 % IV SOLN
INTRAVENOUS | Status: DC
  Administered 2018-08-04: 16:00:00 via INTRAVENOUS
  Administered 2018-08-04: 1000 mL via INTRAVENOUS

## 2018-08-04 MED ORDER — LIDOCAINE HCL (CARDIAC) PF 50 MG/5ML IV SOSY
PREFILLED_SYRINGE | INTRAVENOUS | Status: DC | PRN
  Administered 2018-08-04: 50 mg via INTRAVENOUS

## 2018-08-04 MED ORDER — BUPIVACAINE LIPOSOME 1.3 % IJ SUSP
INTRAMUSCULAR | Status: AC
  Filled 2018-08-04: qty 20

## 2018-08-04 MED ORDER — HYDROCODONE-ACETAMINOPHEN 7.5-325 MG PO TABS: 1.0000 | ORAL_TABLET | Freq: Once | ORAL | Status: DC | PRN

## 2018-08-04 MED ORDER — SODIUM CHLORIDE 0.9 % IR SOLN
Status: DC | PRN
  Administered 2018-08-04: 1000 mL
  Administered 2018-08-04: 3000 mL

## 2018-08-04 MED ORDER — FENTANYL CITRATE (PF) 100 MCG/2ML IJ SOLN: 25.0000 ug | INTRAMUSCULAR | Status: DC | PRN

## 2018-08-04 MED ORDER — DEXAMETHASONE SODIUM PHOSPHATE 10 MG/ML IJ SOLN
INTRAMUSCULAR | Status: DC | PRN
  Administered 2018-08-04: 4 mg via INTRAVENOUS

## 2018-08-04 MED ORDER — LACTATED RINGERS IV SOLN
INTRAVENOUS | Status: DC
  Administered 2018-08-04: 18:00:00 via INTRAVENOUS

## 2018-08-04 MED ORDER — ONDANSETRON HCL 4 MG/2ML IJ SOLN
INTRAMUSCULAR | Status: DC | PRN
  Administered 2018-08-04: 4 mg via INTRAVENOUS

## 2018-08-04 MED ORDER — BUPIVACAINE HCL (PF) 0.5 % IJ SOLN
INTRAMUSCULAR | Status: AC
  Filled 2018-08-04: qty 30

## 2018-08-04 SURGICAL SUPPLY — 51 items
BAG RETRIEVAL 10MM (BASKET) ×1
BANDAGE STRIP 1X3 FLEXIBLE (GAUZE/BANDAGES/DRESSINGS) ×12 IMPLANT
BLADE SURG SZ11 CARB STEEL (BLADE) ×4 IMPLANT
CLOSURE WOUND 1/4 X3 (GAUZE/BANDAGES/DRESSINGS) ×1
CLOTH BEACON ORANGE TIMEOUT ST (SAFETY) ×4 IMPLANT
COVER LIGHT HANDLE STERIS (MISCELLANEOUS) ×8 IMPLANT
DECANTER SPIKE VIAL GLASS SM (MISCELLANEOUS) ×4 IMPLANT
DURAPREP 26ML APPLICATOR (WOUND CARE) ×4 IMPLANT
ELECT REM PT RETURN 9FT ADLT (ELECTROSURGICAL) ×4
ELECTRODE REM PT RTRN 9FT ADLT (ELECTROSURGICAL) ×2 IMPLANT
FILTER SMOKE EVAC LAPAROSHD (FILTER) ×4 IMPLANT
GAUZE SPONGE 4X4 12PLY STRL (GAUZE/BANDAGES/DRESSINGS) ×4 IMPLANT
GLOVE BIOGEL PI IND STRL 6.5 (GLOVE) ×2 IMPLANT
GLOVE BIOGEL PI IND STRL 7.0 (GLOVE) ×6 IMPLANT
GLOVE BIOGEL PI IND STRL 9 (GLOVE) ×4 IMPLANT
GLOVE BIOGEL PI INDICATOR 6.5 (GLOVE) ×2
GLOVE BIOGEL PI INDICATOR 7.0 (GLOVE) ×6
GLOVE BIOGEL PI INDICATOR 9 (GLOVE) ×4
GLOVE ECLIPSE 9.0 STRL (GLOVE) ×4 IMPLANT
GLOVE SURG SS PI 6.5 STRL IVOR (GLOVE) ×4 IMPLANT
GOWN SPEC L3 XXLG W/TWL (GOWN DISPOSABLE) ×4 IMPLANT
GOWN STRL REUS W/TWL LRG LVL3 (GOWN DISPOSABLE) ×4 IMPLANT
INST SET LAPROSCOPIC GYN AP (KITS) ×4 IMPLANT
IV NS IRRIG 3000ML ARTHROMATIC (IV SOLUTION) ×4 IMPLANT
KIT TURNOVER KIT A (KITS) ×4 IMPLANT
MANIFOLD NEPTUNE II (INSTRUMENTS) ×4 IMPLANT
NEEDLE HYPO 25X1 1.5 SAFETY (NEEDLE) ×4 IMPLANT
NEEDLE INSUFFLATION 14GA 120MM (NEEDLE) ×4 IMPLANT
NS IRRIG 1000ML POUR BTL (IV SOLUTION) ×4 IMPLANT
PACK PERI GYN (CUSTOM PROCEDURE TRAY) ×4 IMPLANT
PAD ARMBOARD 7.5X6 YLW CONV (MISCELLANEOUS) ×4 IMPLANT
SET BASIN LINEN APH (SET/KITS/TRAYS/PACK) ×4 IMPLANT
SET TUBE IRRIG SUCTION NO TIP (IRRIGATION / IRRIGATOR) ×4 IMPLANT
SHEARS HARMONIC ACE PLUS 36CM (ENDOMECHANICALS) ×4 IMPLANT
SLEEVE ENDOPATH XCEL 5M (ENDOMECHANICALS) ×4 IMPLANT
SOLUTION ANTI FOG 6CC (MISCELLANEOUS) ×4 IMPLANT
STRIP CLOSURE SKIN 1/4X3 (GAUZE/BANDAGES/DRESSINGS) ×3 IMPLANT
SUT VIC AB 4-0 PS2 27 (SUTURE) ×4 IMPLANT
SUT VICRYL 0 UR6 27IN ABS (SUTURE) ×4 IMPLANT
SYR 10ML LL (SYRINGE) ×4 IMPLANT
SYR BULB IRRIGATION 50ML (SYRINGE) ×4 IMPLANT
SYR CONTROL 10ML LL (SYRINGE) ×4 IMPLANT
SYS BAG RETRIEVAL 10MM (BASKET) ×3
SYSTEM BAG RETRIEVAL 10MM (BASKET) ×2 IMPLANT
TAPE CLOTH SURG 4X10 WHT LF (GAUZE/BANDAGES/DRESSINGS) ×4 IMPLANT
TRAY FOLEY W/BAG SLVR 16FR (SET/KITS/TRAYS/PACK) ×2
TRAY FOLEY W/BAG SLVR 16FR ST (SET/KITS/TRAYS/PACK) ×2 IMPLANT
TROCAR ENDO BLADELESS 11MM (ENDOMECHANICALS) ×4 IMPLANT
TROCAR XCEL NON-BLD 5MMX100MML (ENDOMECHANICALS) ×4 IMPLANT
TUBING INSUFFLATION (TUBING) ×4 IMPLANT
WARMER LAPAROSCOPE (MISCELLANEOUS) ×4 IMPLANT

## 2018-08-04 NOTE — Transfer of Care (Addendum)
Immediate Anesthesia Transfer of Care Note  Patient: [name]  Procedure(s) Performed: DIAGNOSTIC LAPAROSCOPY WITH REMOVAL OF ECTOPIC PREGNANCY (Right ) LAPAROSCOPIC RIGHT SALPINGECTOMY WITH REMOVAL OF ECTOPIC PREGNANCY  Patient Location: PACU  Anesthesia Type:General  Level of Consciousness: awake  Airway & Oxygen Therapy: Patient Spontanous Breathing  Post-op Assessment: Report given to RN  Post vital signs: Reviewed and stable  Last Vitals:  Vitals Value Taken Time  BP 118/55 08/04/2018  6:11 PM  Temp 36.5 C 08/04/2018  6:11 PM  Pulse 95 08/04/2018  6:11 PM  Resp 17 08/04/2018  6:11 PM  SpO2 100 % 08/04/2018  6:11 PM    Last Pain:  Vitals:   08/04/18 1141  PainSc: 10-Worst pain ever         Complications: No apparent anesthesia complications

## 2018-08-04 NOTE — Discharge Instructions (Signed)
Ruptured Ectopic Pregnancy °An ectopic pregnancy is when the fertilized egg attaches (implants) outside the uterus. Most ectopic pregnancies occur in the fallopian tube. Rarely do ectopic pregnancies occur on the ovary, intestine, pelvis, or cervix. An ectopic pregnancy does not have the ability to develop into a normal, healthy baby. °A ruptured ectopic pregnancy is one in which the fallopian tube gets torn or bursts and results in internal bleeding. Often there is intense abdominal pain, and sometimes, vaginal bleeding. Having an ectopic pregnancy can be a life-threatening experience. If left untreated, this dangerous condition can lead to a blood transfusion, abdominal surgery, or even death. °What are the causes? °Damage to the fallopian tubes is the suspected cause in most ectopic pregnancies. °What increases the risk? °Depending on your circumstances, the amount of risk of having an ectopic pregnancy will vary. There are 3 categories that may help you identify whether you are potentially at risk. °High Risk °· You have gone through infertility treatment. °· You have had a previous ectopic pregnancy. °· You have had previous tubal surgery. °· You have had previous surgery to have the fallopian tubes tied (tubal ligation). °· You have tubal problems or diseases. °· You have been exposed to DES. DES is a medicine that was used until 1971 and had effects on babies whose mothers took the medicine. °· You become pregnant while using an intrauterine device (IUD) for birth control. ° °Moderate Risk °· You have a history of infertility. °· You have a history of a sexually transmitted infection (STI). °· You have a history of pelvic inflammatory disease (PID). °· You have scarring from endometriosis. °· You have multiple sexual partners. °· You smoke. ° °Low Risk °· You have had previous pelvic surgery. °· You use vaginal douching. °· You became sexually active before 27 years of age. ° °What are the signs or  symptoms? °An ectopic pregnancy should be suspected in anyone who has missed a period and has abdominal pain or bleeding. °· You may experience normal pregnancy symptoms, such as: °? Nausea. °? Tiredness. °? Breast tenderness. °· Symptoms that are not normal include: °? Pain with intercourse. °? Irregular vaginal bleeding or spotting. °? Cramping or pain on one side, or in the lower abdomen. °? Fast heartbeat. °? Passing out while having a bowel movement. °· Symptoms of a ruptured ectopic pregnancy and internal bleeding may include: °? Sudden, severe pain in the abdomen and pelvis. °? Dizziness or fainting. °? Pain in the shoulder area. ° °How is this diagnosed? °Tests that may be performed include: °· A pregnancy test. °· An ultrasound. °· Testing the specific level of pregnancy hormone in the bloodstream. °· Taking a sample of uterus tissue (dilation and curettage, D&C). °· Surgery to perform a visual exam of the inside of the abdomen using a lighted tube (laparoscopy). ° °How is this treated? °Laparoscopic surgery or abdominal surgery is recommended for a ruptured ectopic pregnancy. °· The whole fallopian tube may need to be removed (salpingectomy). °· If the tube is not too damaged, the tube may be saved, and the pregnancy will be surgically removed. In time, the tube may still function. °· If you have lost a lot of blood, you may need a blood transfusion. °· You may receive a Rho (D) immune globulin shot if you are Rh negative and the father is Rh positive, or if you do not know the Rh type of the father. This is to prevent problems with any future pregnancy. ° °Get help right   away if: You have any symptoms of an ectopic or ruptured ectopic pregnancy. This is a medical emergency. This information is not intended to replace advice given to you by your health care provider. Make sure you discuss any questions you have with your health care provider. Document Released: 12/09/2000 Document Revised: 07/01/2016  Document Reviewed: 09/23/2013 Elsevier Interactive Patient Education  2018 [employer] Diagnostic Laparoscopy A diagnostic laparoscopy is a procedure to diagnose diseases in the abdomen. During the procedure, a thin, lighted, pencil-sized instrument called a laparoscope is inserted into the abdomen through an incision. The laparoscope allows your health care provider to look at the organs inside your body. Tell a health care provider about:  Any allergies you have.  All medicines you are taking, including vitamins, herbs, eye drops, creams, and over-the-counter medicines.  Any problems you or family members have had with anesthetic medicines.  Any blood disorders you have.  Any surgeries you have had.  Any medical conditions you have. What are the risks? Generally, this is a safe procedure. However, problems can occur, which may include:  Infection.  Bleeding.  Damage to other organs.  Allergic reaction to the anesthetics used during the procedure.  What happens before the procedure?  Do not eat or drink anything after midnight on the night before the procedure or as directed by your health care provider.  Ask your health care provider about: ? Changing or stopping your regular medicines. ? Taking medicines such as aspirin and ibuprofen. These medicines can thin your blood. Do not take these medicines before your procedure if your health care provider instructs you not to.  Plan to have someone take you home after the procedure. What happens during the procedure?  You may be given a medicine to help you relax (sedative).  You will be given a medicine to make you sleep (general anesthetic).  Your abdomen will be inflated with a gas. This will make your organs easier to see.  Small incisions will be made in your abdomen.  A laparoscope and other small instruments will be inserted into the abdomen through the incisions.  A tissue sample may be removed from an organ in  the abdomen for examination.  The instruments will be removed from the abdomen.  The gas will be released.  The incisions will be closed with stitches (sutures). What happens after the procedure? Your blood pressure, heart rate, breathing rate, and blood oxygen level will be monitored often until the medicines you were given have worn off. This information is not intended to replace advice given to you by your health care provider. Make sure you discuss any questions you have with your health care provider. Document Released: 03/20/2001 Document Revised: 04/21/2016 Document Reviewed: 07/25/2014 Elsevier Interactive Patient Education  [employer]2018 Elsevier Inc.

## 2018-08-04 NOTE — Anesthesia Procedure Notes (Signed)
Procedure Name: Intubation Date/Time: 08/04/2018 5:12 PM Performed by: Lenice Llamas, MD Pre-anesthesia Checklist: Patient identified, Patient being monitored, Timeout performed, Emergency Drugs available and Suction available Patient Re-evaluated:Patient Re-evaluated prior to induction Oxygen Delivery Method: Circle System Utilized Preoxygenation: Pre-oxygenation with 100% oxygen Induction Type: IV induction Laryngoscope Size: Mac and 3 Grade View: Grade I Tube type: Oral Tube size: 7.0 mm Number of attempts: 1 Airway Equipment and Method: Stylet Placement Confirmation: ETT inserted through vocal cords under direct vision,  positive ETCO2 and breath sounds checked- equal and bilateral Secured at: 22 cm Tube secured with: Tape Dental Injury: Teeth and Oropharynx as per pre-operative assessment

## 2018-08-04 NOTE — Brief Op Note (Signed)
08/04/2018  6:14 PM  PATIENT:  [name]  27 y.o. female  PRE-OPERATIVE DIAGNOSIS:  right ectopic pregnancy  POST-OPERATIVE DIAGNOSIS: Right ectopic pregnancy bladder flap adhesions  PROCEDURE:  Procedure(s): DIAGNOSTIC LAPAROSCOPY WITH REMOVAL OF ECTOPIC PREGNANCY (Right) LAPAROSCOPIC RIGHT SALPINGECTOMY WITH REMOVAL OF ECTOPIC PREGNANCY  SURGEON:  Surgeon(s) and Role:    Tilda Burrow, MD - Primary  PHYSICIAN ASSISTANT:   ASSISTANTS: none   ANESTHESIA:   general  EBL:50 cc   blood ADMINISTERED:none  DRAINS: none   LOCAL MEDICATIONS USED:  NONE  SPECIMEN:  Source of Specimen:  Right fallopian tube and ectopic pregnancy  DISPOSITION OF SPECIMEN:  PATHOLOGY  COUNTS:  YES  TOURNIQUET:  * No tourniquets in log *  DICTATION: .Dragon Dictation  PLAN OF CARE: Discharge to home after PACU  PATIENT DISPOSITION:  PACU - hemodynamically stable.   Delay start of Pharmacological VTE agent (>24hrs) due to surgical blood loss or risk of bleeding: not applicable Details of procedure: Patient was taken the operating room on Saturday afternoon, directly from the emergency room, abdomen prepped and draped, timeout conducted and procedure confirmed by surgical team.  Infraumbilical vertical 1 cm skin incision was made as well as a transverse suprapubic incision along the old C-section line and a right lower quadrant 110 m incision.  Veress needle was introduced through the umbilicus being careful oriented toward the pelvis while elevating the abdominal wall and water droplet technique confirmed intraperitoneal free flow of fluid.  Pneumoperitoneum under 6 mmHg pressure was performed to 3 L CO2 after which the left millimeter laparoscopic trocar was introduced through the umbilicus and the suprapubic and right lower quadrant trochars placed under direct visualization.  There was some omental adhesions to the anterior abdominal wall which were taken down with harmonic a 7 transection.   Visualization of the pelvis showed blood in the pelvis and a bleeding from the tip fimbria of the right tube where the ectopic pregnancy was visible in the midportion of the tube.  Ectomy followed.  The fallopian tube could be elevated, grasped with the laparoscopic grasper, harmonic a 7 used through the right lower quadrant trocar to amputate the tube off of the mesosalpinx with good hemostasis.  Specimen was placed in the cul-de-sac.  The blood in the pelvis was evacuated with suction.  The pelvis was inspected and there was rather significant amount adhesions in the bladder flap area from the old C-section.  A portion of these were taken down with harmonic a 7 to hopefully make a little easier should she have another cesarean.  No time was there any concerned that we were entering the bladder.  The Endo Catch bag was then placed through the umbilical site while a 5 mm camera was used through the right lower quadrant trocar site to visualize placing the ectopic in the Endo Catch bag which was then extracted through the umbilical site.  Abdomen was irrigated and 120 cc estimate of fluid was left in the abdomen to assist with evacuation of the carbon dioxide.  Deflation of the abdomen followed, laparoscopic trochars were removed, and then the umbilical fascia closed using S retractors to identify the fascia which was tied together with 0 Vicryl interrupted suture without difficulty.  The subcuticular 4-0 Vicryl closure of the skin incisions followed and Steri-Strips were applied.  There was a little bit of oozing from the skin bleeder at the incision suprapubic that required a pressure dressing which will be removed in the recovery room.  Sponge stick was removed from the vagina worried been used for uterine manipulation and Foley catheter removed.  Sponge and needle counts correct urine output 75 cc estimated blood loss 50 cc all old blood previous previously in the belly at the time of the start of the surgery.   Sponge and needle counts were correct condition to recovery room good patient will receive Toradol 30 mg IV and will be discharged home on Toradol 10 mg p.o. and Vicodin.  Blood type B+ confirmed

## 2018-08-04 NOTE — H&P (Signed)
[name] is an [age] y.o. female. She is admitted for probable right ectopic pregnancy.  [facility] with right lower quadrant pain of one-week duration.  [facility] 2 days ago her quantitative hCG was 1362, hemoglobin 13.2 white count 8900 hematocrit 40%.  She has had a decline in her quantitative hCG, today is 1217, indicating an abnormal pregnancy, and ultrasound today shows a area of fluid and irregularly formed mass in the area of the right adnexa consistent with a probable ectopic pregnancy.  Patient had pain in the right quadrant which is severe enough for her to return to the emergency room.  She is had explanations of medical management of ectopic with methotrexate versus surgical management with probable salpingectomy.  She declines consideration of methotrexate due to the complexity of follow-up.  Plans are to proceed with laparoscopic surgery patient is aware that probable salpingectomy is planned, removing the tube, however we will evaluate the tube at the time of surgery for consideration for salvage type procedure.  The patient is quite stable dynamically.  Technical aspects of this procedure been reviewed including risks of complications such as bleeding requiring transfusion, need for laparotomy, injury to adjacent organs. Pertinent Gynecological History: Menses:  Bleeding: Light bleeding for the last week Contraception: none DES exposure: unknown Blood transfusions: none Sexually transmitted diseases: no past history Previous GYN Procedures: Cesarean section x2  Last mammogram:  Date:  Last pap:  Date:  OB History: G4, P3003   Menstrual History: Menarche age:  Patient's last menstrual period was 05/09/2018.    Past Medical History:  Diagnosis Date  . Medical history non-contributory     Past Surgical History:  Procedure Laterality Date  . CESAREAN SECTION  06/22/2008  . CESAREAN SECTION N/A 01/01/2015   Procedure:  CESAREAN SECTION;  Surgeon: Lesly DukesKelly H Leggett, MD;  Location: [facility];  Service: Obstetrics;  Laterality: N/A;    Family History  Problem Relation Age of Onset  . Diabetes Maternal Grandmother   . Hypertension Maternal Grandmother   . Stroke Maternal Grandmother   . Miscarriages / [place] Mother     Social History:  reports that she has quit smoking. Her smoking use included cigarettes. She has never used smokeless tobacco. She reports that she does not drink alcohol or use drugs.  Allergies: No Known Allergies   (Not in a hospital admission)  ROS  Height 4\' 11"  (1.499 m), weight 57.2 kg, last menstrual period 05/09/2018, unknown if currently breastfeeding. Physical Exam  Constitutional: She is oriented to person, place, and time. She appears well-developed and well-nourished. No distress.  HENT:  Head: Normocephalic.  Eyes: Pupils are equal, round, and reactive to light.  Neck: Normal range of motion.  Cardiovascular: Normal rate.  Respiratory: Effort normal.  GI: Soft. There is tenderness.  Right lower quadrant tenderness slight guarding  Genitourinary:  Genitourinary Comments: Transvaginal ultrasound is reviewed with normal shaped uterus anteflexed with no evidence of intrauterine pregnancy, adnexa shows irregular heterogeneous tissue in the right adnexa consistent with ectopic pregnancy adjacent to the ovary  Neurological: She is alert and oriented to person, place, and time.  Skin: Skin is warm and dry. No pallor.  Psychiatric: She has a normal mood and affect. Her behavior is normal. Judgment and thought content normal.    Results for orders placed or performed during the hospital encounter of 08/04/18 (from the past 24 hour(s))  Urinalysis, Routine w reflex microscopic     Status:  Abnormal   Collection Time: 08/04/18 11:25 AM  Result Value Ref Range   Color, Urine YELLOW YELLOW   APPearance CLEAR CLEAR   Specific Gravity, Urine 1.024 1.005 - 1.030   pH 7.0 5.0 -  8.0   Glucose, UA NEGATIVE NEGATIVE mg/dL   Hgb urine dipstick MODERATE (A) NEGATIVE   Bilirubin Urine NEGATIVE NEGATIVE   Ketones, ur NEGATIVE NEGATIVE mg/dL   Protein, ur NEGATIVE NEGATIVE mg/dL   Nitrite NEGATIVE NEGATIVE   Leukocytes, UA NEGATIVE NEGATIVE   RBC / HPF 0-5 0 - 5 RBC/hpf   WBC, UA 0-5 0 - 5 WBC/hpf   Bacteria, UA NONE SEEN NONE SEEN   Squamous Epithelial / LPF 0-5 0 - 5   Mucus PRESENT   POC Urine Pregnancy, ED (do NOT order at [facility])     Status: Abnormal   Collection Time: 08/04/18 11:40 AM  Result Value Ref Range   Preg Test, Ur POSITIVE (A) NEGATIVE  CBC with Differential     Status: None   Collection Time: 08/04/18 12:31 PM  Result Value Ref Range   WBC 6.6 4.0 - 10.5 K/uL   RBC 4.48 3.87 - 5.11 MIL/uL   Hemoglobin 13.8 12.0 - 15.0 g/dL   HCT 60.4 54.0 - 98.1 %   MCV 91.1 78.0 - 100.0 fL   MCH 30.8 26.0 - 34.0 pg   MCHC 33.8 30.0 - 36.0 g/dL   RDW 19.1 47.8 - 29.5 %   Platelets 248 150 - 400 K/uL   Neutrophils Relative % 69 %   Neutro Abs 4.5 1.7 - 7.7 K/uL   Lymphocytes Relative 24 %   Lymphs Abs 1.6 0.7 - 4.0 K/uL   Monocytes Relative 6 %   Monocytes Absolute 0.4 0.1 - 1.0 K/uL   Eosinophils Relative 1 %   Eosinophils Absolute 0.1 0.0 - 0.7 K/uL   Basophils Relative 0 %   Basophils Absolute 0.0 0.0 - 0.1 K/uL  Comprehensive metabolic panel     Status: Abnormal   Collection Time: 08/04/18 12:31 PM  Result Value Ref Range   Sodium 137 135 - 145 mmol/L   Potassium 3.9 3.5 - 5.1 mmol/L   Chloride 105 98 - 111 mmol/L   CO2 26 22 - 32 mmol/L   Glucose, Bld 97 70 - 99 mg/dL   BUN 12 6 - 20 mg/dL   Creatinine, Ser 6.21 0.44 - 1.00 mg/dL   Calcium 8.7 (L) 8.9 - 10.3 mg/dL   Total Protein 8.3 (H) 6.5 - 8.1 g/dL   Albumin 4.5 3.5 - 5.0 g/dL   AST 22 15 - 41 U/L   ALT 21 0 - 44 U/L   Alkaline Phosphatase 49 38 - 126 U/L   Total Bilirubin 0.4 0.3 - 1.2 mg/dL   GFR calc non Af Amer >60 >60 mL/min   GFR calc Af Amer >60 >60 mL/min   Anion gap 6 5 - 15   hCG, quantitative, pregnancy     Status: Abnormal   Collection Time: 08/04/18 12:31 PM  Result Value Ref Range   hCG, Beta Chain, Quant, S 1,217 (H) <5 mIU/mL  ABO/Rh     Status: None   Collection Time: 08/04/18 12:33 PM  Result Value Ref Range   ABO/RH(D)      B POS Performed at [facility], [address], [place], [place] [address]     US Ob Comp < 14 Wks  Result Date: 08/04/2018 CLINICAL DATA:  Right adnexal region pain for  1 day. Bleeding for 1 week. Patient reportedly is pregnant [redacted] weeks and 3 days based on the last menstrual period. EXAM: OBSTETRIC <14 WK [place] AND TRANSVAGINAL OB US TECHNIQUE: Both transabdominal and transvaginal ultrasound examinations were performed for complete evaluation of the gestation as well as the maternal uterus, adnexal regions, and pelvic cul-de-sac. Transvaginal technique was performed to assess early pregnancy. COMPARISON:  None. FINDINGS: Intrauterine gestational sac: None Yolk sac:  Visualized. Embryo:  Visualized. Maternal uterus/adnexae: Uterus is somewhat heterogeneous appearance. No uterine mass. No endometrial fluid. Cervix is closed. Right ovary is not well-defined. There is heterogeneous echogenic material contiguous with the right ovary. Ovary measures approximately 3.1 x 1.9 x 2.7 cm. There is adjacent pelvic free fluid, small amount, but with some internal echoes. Left ovary is normal in size and echogenicity. No left adnexal masses. IMPRESSION: 1. No intrauterine pregnancy. 2. There is heterogeneous material adjacent to the right ovary with an adjacent small amount of pelvic free fluid. Given the lack of an intrauterine pregnancy and the positive pregnancy test, an ectopic pregnancy is possible. Alternatively, patient may have had a previous miscarriage with the current findings due to a ruptured ovarian cyst. Follow-up with serial beta HCG levels and repeat ultrasound in 7-10 days is recommended. Electronically Signed   By: Amie Portland M.D.   On:  08/04/2018 14:16   US Ob Transvaginal  Result Date: 08/04/2018 CLINICAL DATA:  Right adnexal region pain for 1 day. Bleeding for 1 week. Patient reportedly is pregnant [redacted] weeks and 3 days based on the last menstrual period. EXAM: OBSTETRIC <14 WK [place] AND TRANSVAGINAL OB US TECHNIQUE: Both transabdominal and transvaginal ultrasound examinations were performed for complete evaluation of the gestation as well as the maternal uterus, adnexal regions, and pelvic cul-de-sac. Transvaginal technique was performed to assess early pregnancy. COMPARISON:  None. FINDINGS: Intrauterine gestational sac: None Yolk sac:  Visualized. Embryo:  Visualized. Maternal uterus/adnexae: Uterus is somewhat heterogeneous appearance. No uterine mass. No endometrial fluid. Cervix is closed. Right ovary is not well-defined. There is heterogeneous echogenic material contiguous with the right ovary. Ovary measures approximately 3.1 x 1.9 x 2.7 cm. There is adjacent pelvic free fluid, small amount, but with some internal echoes. Left ovary is normal in size and echogenicity. No left adnexal masses. IMPRESSION: 1. No intrauterine pregnancy. 2. There is heterogeneous material adjacent to the right ovary with an adjacent small amount of pelvic free fluid. Given the lack of an intrauterine pregnancy and the positive pregnancy test, an ectopic pregnancy is possible. Alternatively, patient may have had a previous miscarriage with the current findings due to a ruptured ovarian cyst. Follow-up with serial beta HCG levels and repeat ultrasound in 7-10 days is recommended. Electronically Signed   By: Amie Portland M.D.   On: 08/04/2018 14:16    Assessment/Plan: Right ectopic pregnancy Plan: To operating room for laparoscopic removal of right ectopic pregnancy probable right salpingectomy  Tilda Burrow 08/04/2018, 3:34 PM

## 2018-08-04 NOTE — ED Provider Notes (Signed)
Emergency Department Provider Note   I have reviewed the triage vital signs and the nursing notes.   HISTORY  Chief Complaint Pelvic Pain   HPI [name] is a 27 y.o. female with early pregnancy of unknown origin presents to the emergency department with right lower quadrant abdominal pain with continued vaginal bleeding.  The right sided pain has returned.  She was evaluated in outside emergency department 6 days ago with right-sided pain.  She tested positive for pregnancy with quantitative hCG of around 1500.  Pelvic ultrasound at that time did not identify a pregnancy location.  He followed up with OB and her hCG levels have increased to 2500 but she was not having right-sided pain so she is due to return in 2 days for repeat hCG.  Today, however, the patient had return of the right-sided abdominal pain.  She continues to have intermittent bright red bleeding and clot passage.  No fevers or chills.  No radiation of symptoms or other modifying factors.   Past Medical History:  Diagnosis Date  . Medical history non-contributory     Patient Active Problem List   Diagnosis Date Noted  . Indication for care in labor or delivery 01/01/2015  . Supervision of other normal pregnancy 06/11/2014  . History of successful vaginal birth after cesarean, currently pregnant 06/11/2014    Past Surgical History:  Procedure Laterality Date  . CESAREAN SECTION  06/22/2008  . CESAREAN SECTION N/A 01/01/2015   Procedure: CESAREAN SECTION;  Surgeon: Lesly DukesKelly H Leggett, MD;  Location: [facility];  Service: Obstetrics;  Laterality: N/A;      Allergies Patient has no known allergies.  Family History  Problem Relation Age of Onset  . Diabetes Maternal Grandmother   . Hypertension Maternal Grandmother   . Stroke Maternal Grandmother   . Miscarriages / [place] Mother     Social History Social History   Tobacco Use  . Smoking status: Former Smoker    Types: Cigarettes  . Smokeless  tobacco: Never Used  Substance Use Topics  . Alcohol use: No  . Drug use: No    Review of Systems  Constitutional: No fever/chills Eyes: No visual changes. ENT: No sore throat. Cardiovascular: Denies chest pain. Respiratory: Denies shortness of breath. Gastrointestinal: Positive RLQ abdominal pain.  No nausea, no vomiting.  No diarrhea.  No constipation. Genitourinary: Negative for dysuria. Positive vaginal bleeding.  Musculoskeletal: Negative for back pain. Skin: Negative for rash. Neurological: Negative for headaches, focal weakness or numbness.  10-point ROS otherwise negative.  ____________________________________________   PHYSICAL EXAM:  VITAL SIGNS: Vitals:   08/04/18 1554 08/04/18 1600  BP:  118/72  Pulse: 72 70  SpO2: 100% 100%    Constitutional: Alert and oriented. Well appearing and in no acute distress. Eyes: Conjunctivae are normal.  Head: Atraumatic. Nose: No congestion/rhinnorhea. Mouth/Throat: Mucous membranes are moist.  Neck: No stridor.  Cardiovascular: Normal rate, regular rhythm. Good peripheral circulation. Grossly normal heart sounds.   Respiratory: Normal respiratory effort.  No retractions. Lungs CTAB. Gastrointestinal: Soft with mild RLQ tenderness. No rebound or guarding. No distention.  Musculoskeletal: No lower extremity tenderness nor edema. No gross deformities of extremities. Neurologic:  Normal speech and language. No gross focal neurologic deficits are appreciated.  Skin:  Skin is warm, dry and intact. No rash noted.  ____________________________________________   LABS (all labs ordered are listed, but only abnormal results are displayed)  Labs Reviewed  URINALYSIS, ROUTINE W REFLEX MICROSCOPIC - Abnormal; Notable for the following components:  Result Value   Hgb urine dipstick MODERATE (*)    All other components within normal limits  COMPREHENSIVE METABOLIC PANEL - Abnormal; Notable for the following components:    Calcium 8.7 (*)    Total Protein 8.3 (*)    All other components within normal limits  HCG, QUANTITATIVE, PREGNANCY - Abnormal; Notable for the following components:   hCG, Beta Chain, Quant, S 1,217 (*)    All other components within normal limits  POC URINE PREG, ED - Abnormal; Notable for the following components:   Preg Test, Ur POSITIVE (*)    All other components within normal limits  CBC WITH DIFFERENTIAL/PLATELET  ABO/RH   ____________________________________________  RADIOLOGY  US Ob Comp < 14 Wks  Result Date: 08/04/2018 CLINICAL DATA:  Right adnexal region pain for 1 day. Bleeding for 1 week. Patient reportedly is pregnant [redacted] weeks and 3 days based on the last menstrual period. EXAM: OBSTETRIC <14 WK [place] AND TRANSVAGINAL OB US TECHNIQUE: Both transabdominal and transvaginal ultrasound examinations were performed for complete evaluation of the gestation as well as the maternal uterus, adnexal regions, and pelvic cul-de-sac. Transvaginal technique was performed to assess early pregnancy. COMPARISON:  None. FINDINGS: Intrauterine gestational sac: None Yolk sac:  Visualized. Embryo:  Visualized. Maternal uterus/adnexae: Uterus is somewhat heterogeneous appearance. No uterine mass. No endometrial fluid. Cervix is closed. Right ovary is not well-defined. There is heterogeneous echogenic material contiguous with the right ovary. Ovary measures approximately 3.1 x 1.9 x 2.7 cm. There is adjacent pelvic free fluid, small amount, but with some internal echoes. Left ovary is normal in size and echogenicity. No left adnexal masses. IMPRESSION: 1. No intrauterine pregnancy. 2. There is heterogeneous material adjacent to the right ovary with an adjacent small amount of pelvic free fluid. Given the lack of an intrauterine pregnancy and the positive pregnancy test, an ectopic pregnancy is possible. Alternatively, patient may have had a previous miscarriage with the current findings due to a ruptured  ovarian cyst. Follow-up with serial beta HCG levels and repeat ultrasound in 7-10 days is recommended. Electronically Signed   By: Amie Portland M.D.   On: 08/04/2018 14:16   US Ob Transvaginal  Result Date: 08/04/2018 CLINICAL DATA:  Right adnexal region pain for 1 day. Bleeding for 1 week. Patient reportedly is pregnant [redacted] weeks and 3 days based on the last menstrual period. EXAM: OBSTETRIC <14 WK [place] AND TRANSVAGINAL OB US TECHNIQUE: Both transabdominal and transvaginal ultrasound examinations were performed for complete evaluation of the gestation as well as the maternal uterus, adnexal regions, and pelvic cul-de-sac. Transvaginal technique was performed to assess early pregnancy. COMPARISON:  None. FINDINGS: Intrauterine gestational sac: None Yolk sac:  Visualized. Embryo:  Visualized. Maternal uterus/adnexae: Uterus is somewhat heterogeneous appearance. No uterine mass. No endometrial fluid. Cervix is closed. Right ovary is not well-defined. There is heterogeneous echogenic material contiguous with the right ovary. Ovary measures approximately 3.1 x 1.9 x 2.7 cm. There is adjacent pelvic free fluid, small amount, but with some internal echoes. Left ovary is normal in size and echogenicity. No left adnexal masses. IMPRESSION: 1. No intrauterine pregnancy. 2. There is heterogeneous material adjacent to the right ovary with an adjacent small amount of pelvic free fluid. Given the lack of an intrauterine pregnancy and the positive pregnancy test, an ectopic pregnancy is possible. Alternatively, patient may have had a previous miscarriage with the current findings due to a ruptured ovarian cyst. Follow-up with serial beta HCG levels and repeat ultrasound in  7-10 days is recommended. Electronically Signed   By: Amie Portland M.D.   On: 08/04/2018 14:16    ____________________________________________   PROCEDURES  Procedure(s) performed:   Procedures  CRITICAL CARE Performed by: Maia Plan Total  critical care time: 35 minutes Critical care time was exclusive of separately billable procedures and treating other patients. Critical care was necessary to treat or prevent imminent or life-threatening deterioration. Critical care was time spent personally by me on the following activities: development of treatment plan with patient and/or surrogate as well as nursing, discussions with consultants, evaluation of patient's response to treatment, examination of patient, obtaining history from patient or surrogate, ordering and performing treatments and interventions, ordering and review of laboratory studies, ordering and review of radiographic studies, pulse oximetry and re-evaluation of patient's condition.  Alona Bene, MD Emergency Medicine  ____________________________________________   INITIAL IMPRESSION / ASSESSMENT AND PLAN / ED COURSE  Pertinent labs & imaging results that were available during my care of the patient were reviewed by me and considered in my medical decision making (see chart for details).  Patient presents to the emergency department for evaluation of return of her right lower quadrant pain in the setting of early pregnancy and rising hCG.  Currently has pregnancy of unknown location.  Will need to evaluate for ectopic pregnancy.  Plan for repeat hCG, labs, pelvic ultrasound.  I have called to alert the ultrasound technician that they will need to come into the emergency department to complete the study.  03:54 PM Spoke with Dr. Emelda Fear who has seen the patient. Plan for ex-lap as patient did not want to undergo Methotrexate therapy. Remains HDS.   Discussed patient's case with OB, Dr. Emelda Fear to request admission. Patient and family (if present) updated with plan. Care transferred to [facility] service.  I reviewed all nursing notes, vitals, pertinent old records, EKGs, labs, imaging (as available).  ____________________________________________  FINAL CLINICAL  IMPRESSION(S) / ED DIAGNOSES  Final diagnoses:  Ectopic pregnancy without intrauterine pregnancy, unspecified location    MEDICATIONS GIVEN DURING THIS VISIT:  Medications  0.9 %  sodium chloride infusion (has no administration in time range)  sodium chloride 0.9 % bolus 500 mL (has no administration in time range)  sodium chloride 0.9 % bolus 500 mL (0 mLs Intravenous Stopped 08/04/18 1317)    Note:  This document was prepared using Dragon voice recognition software and may include unintentional dictation errors.  Alona Bene, MD Emergency Medicine    Nelani Schmelzle, Arlyss Repress, MD 08/04/18 [phone]

## 2018-08-04 NOTE — ED Triage Notes (Signed)
Patient c/o pelvic pain that started just prior to coming to ER. Patient reports vaginal bleeding. Patient is pregnant and had similar pain last week . [facility] [place]  and told possible miscarriage vs ectopic pregnancy. Patient had ultrasound but nothing showed at the time. Patient states HCG levels had increased-LMP may 15.

## 2018-08-04 NOTE — Op Note (Signed)
Please see the brief operative note for surgical details 

## 2018-08-04 NOTE — Anesthesia Postprocedure Evaluation (Signed)
Anesthesia Post Note  Patient: [name]  Procedure(s) Performed: LAPAROSCOPIC RIGHT SALPINGECTOMY WITH REMOVAL OF ECTOPIC PREGNANCY  Patient location during evaluation: PACU Anesthesia Type: General Level of consciousness: awake Pain management: pain level controlled Vital Signs Assessment: post-procedure vital signs reviewed and stable Respiratory status: spontaneous breathing, nonlabored ventilation and respiratory function stable Cardiovascular status: blood pressure returned to baseline Postop Assessment: no headache     Last Vitals:  Vitals:   08/04/18 1600 08/04/18 1811  BP: 118/72 (!) 118/55  Pulse: 70 95  Resp:  17  Temp:  36.5 C  SpO2: 100% 100%    Last Pain:  Vitals:   08/04/18 1811  PainSc: 0-No pain                 Fara BorosJeff S Andilyn Bettcher

## 2018-08-04 NOTE — Anesthesia Preprocedure Evaluation (Signed)
Anesthesia Evaluation  Patient identified by MRN, date of birth, ID band Patient awake    Reviewed: Allergy & Precautions, NPO status , Patient's Chart, lab work & pertinent test results  Airway Mallampati: I  TM Distance: >3 FB Neck ROM: Full    Dental no notable dental hx.    Pulmonary neg pulmonary ROS, former smoker,    Pulmonary exam normal breath sounds clear to auscultation       Cardiovascular negative cardio ROS Normal cardiovascular exam Rhythm:Regular Rate:Normal     Neuro/Psych negative neurological ROS  negative psych ROS   GI/Hepatic negative GI ROS, Neg liver ROS,   Endo/Other  negative endocrine ROS  Renal/GU negative Renal ROS  negative genitourinary   Musculoskeletal negative musculoskeletal ROS (+)   Abdominal   Peds negative pediatric ROS (+)  Hematology negative hematology ROS (+)   Anesthesia Other Findings   Reproductive/Obstetrics negative OB ROS                             Anesthesia Physical Anesthesia Plan  ASA: II and emergent  Anesthesia Plan: General   Post-op Pain Management:    Induction: Intravenous  PONV Risk Score and Plan:   Airway Management Planned: Oral ETT  Additional Equipment:   Intra-op Plan:   Post-operative Plan: Extubation in OR  Informed Consent: I have reviewed the patients History and Physical, chart, labs and discussed the procedure including the risks, benefits and alternatives for the proposed anesthesia with the patient or authorized representative who has indicated his/her understanding and acceptance.   Dental advisory given  Plan Discussed with: CRNA  Anesthesia Plan Comments:         Anesthesia Quick Evaluation

## 2018-08-04 NOTE — ED Notes (Signed)
Pelvic cart at bedside. 

## 2018-08-04 NOTE — Addendum Note (Signed)
Addendum  created 08/04/18 1835 by Despina HiddenIdacavage, Luiz Trumpower J, CRNA   Charge Capture section accepted, Intraprocedure Meds edited

## 2018-08-06 ENCOUNTER — Other Ambulatory Visit: Payer: Self-pay

## 2018-08-07 ENCOUNTER — Encounter (HOSPITAL_COMMUNITY): Payer: Self-pay | Admitting: Obstetrics and Gynecology

## 2018-08-07 ENCOUNTER — Encounter: Payer: Self-pay | Admitting: Obstetrics and Gynecology

## 2018-08-21 ENCOUNTER — Ambulatory Visit (INDEPENDENT_AMBULATORY_CARE_PROVIDER_SITE_OTHER): Payer: Medicaid Other | Admitting: Obstetrics and Gynecology

## 2018-08-21 ENCOUNTER — Encounter: Payer: Self-pay | Admitting: Obstetrics and Gynecology

## 2018-08-21 VITALS — BP 89/56 | HR 69 | Ht 59.0 in | Wt 130.8 lb

## 2018-08-21 DIAGNOSIS — O00101 Right tubal pregnancy without intrauterine pregnancy: Secondary | ICD-10-CM

## 2018-08-21 MED ORDER — NORGESTIMATE-ETH ESTRADIOL 0.25-35 MG-MCG PO TABS: 1.0000 | ORAL_TABLET | Freq: Every day | ORAL | 11 refills | Status: DC

## 2018-08-21 NOTE — Progress Notes (Signed)
   Subjective:  [name] is a 27 y.o. female now 2 weeks status post scopic right salpingectomy ectopic.   She is doing well fully recovered no complaints Review of Systems Negative except none diet:      Bowel movements : normal.  The patient is not having any pain.  Objective:  BP (!) 89/56 (BP Location: Right Arm, Patient Position: Sitting, Cuff Size: Normal)   Pulse 69   Ht 4\' 11"  (1.499 m)   Wt 130 lb 12.8 oz (59.3 kg)   LMP 05/09/2018   BMI 26.42 kg/m  General:Well developed, well nourished.  No acute distress. Abdomen: Bowel sounds normal, soft, non-tender. Incision(s): N/A healing well      Assessment:  Post-Op 2 weeks s/p Laparoscopic right salpingectomy   Stable postoperatively.   Plan:  1.Wound care discussed   2. . current medications.none 3. Activity restrictions: none 4. return to work: now. 5. Follow up in prn  Rx Sprintec.

## 2019-10-24 ENCOUNTER — Other Ambulatory Visit: Payer: Self-pay

## 2019-10-24 ENCOUNTER — Ambulatory Visit (INDEPENDENT_AMBULATORY_CARE_PROVIDER_SITE_OTHER): Payer: Self-pay | Admitting: *Deleted

## 2019-10-24 ENCOUNTER — Encounter: Payer: Self-pay | Admitting: *Deleted

## 2019-10-24 ENCOUNTER — Other Ambulatory Visit: Payer: Self-pay | Admitting: Adult Health

## 2019-10-24 DIAGNOSIS — Z3201 Encounter for pregnancy test, result positive: Secondary | ICD-10-CM

## 2019-10-24 LAB — POCT URINE PREGNANCY: Preg Test, Ur: POSITIVE — AB

## 2019-10-24 MED ORDER — PRENATAL PLUS 27-1 MG PO TABS: 1.0000 | ORAL_TABLET | Freq: Every day | ORAL | 0 refills | Status: DC

## 2019-10-24 MED ORDER — PROMETHAZINE HCL 25 MG PO TABS: 25.0000 mg | ORAL_TABLET | Freq: Four times a day (QID) | ORAL | 1 refills | Status: DC | PRN

## 2019-10-24 NOTE — Progress Notes (Signed)
   NURSE VISIT- PREGNANCY CONFIRMATION   SUBJECTIVE:  [name] is a 28 y.o. [phone] female at [redacted]w[redacted]d by certain LMP of Patient's last menstrual period was 08/02/2019. Here for pregnancy confirmation.  Home pregnancy test: positive x 2  She reports nausea and vomiting.  She is not taking prenatal vitamins.    OBJECTIVE:  LMP 08/02/2019   Breastfeeding No   Appears well, in no apparent distress OB History  Gravida Para Term Preterm AB Living  5 3 3   1 3   SAB TAB Ectopic Multiple Live Births  1     0 3    # Outcome Date GA Lbr Len/2nd Weight Sex Delivery Anes PTL Lv  5 Current           4 Term 01/01/15 [redacted]w[redacted]d 20:28 / 02:51 9 lb 4.7 oz (4.216 kg) M CS-LTranv EPI  LIV  3 Term 05/24/10 [redacted]w[redacted]d  7 lb 5 oz (3.317 kg) F VBAC EPI  LIV  2 Term 06/22/08 [redacted]w[redacted]d  8 lb 7 oz (3.827 kg) M CS-Unspec EPI  LIV     Birth Comments: failed forceps  1 SAB             Results for orders placed or performed in visit on 10/24/19 (from the past 24 hour(s))  POCT urine pregnancy   Collection Time: 10/24/19  2:33 PM  Result Value Ref Range   Preg Test, Ur Positive (A) Negative    ASSESSMENT: Positive pregnancy test, [redacted]w[redacted]d by LMP    PLAN: Schedule for dating ultrasound in 1 week Prenatal vitamins: note routed to [facility] to send prescription   Nausea medicines: requested-note routed to [facility] to send prescription   OB packet given: Yes  Levy Pupa  10/24/2019 2:40 PM

## 2019-10-24 NOTE — Progress Notes (Signed)
rx PNV and phenergan

## 2019-10-24 NOTE — Progress Notes (Addendum)
Chart reviewed for nurse visit. Agree with plan of care. Will Rx PNV and phenergan Estill Dooms, NP 10/24/2019 5:00 PM

## 2019-11-04 ENCOUNTER — Other Ambulatory Visit: Payer: Self-pay | Admitting: Obstetrics and Gynecology

## 2019-11-04 DIAGNOSIS — Z3682 Encounter for antenatal screening for nuchal translucency: Secondary | ICD-10-CM

## 2019-11-05 ENCOUNTER — Other Ambulatory Visit: Payer: Self-pay

## 2019-11-05 ENCOUNTER — Other Ambulatory Visit: Payer: Self-pay | Admitting: Obstetrics and Gynecology

## 2019-11-05 ENCOUNTER — Ambulatory Visit (INDEPENDENT_AMBULATORY_CARE_PROVIDER_SITE_OTHER): Payer: Self-pay

## 2019-11-05 DIAGNOSIS — O3680X0 Pregnancy with inconclusive fetal viability, not applicable or unspecified: Secondary | ICD-10-CM

## 2019-11-05 DIAGNOSIS — Z3682 Encounter for antenatal screening for nuchal translucency: Secondary | ICD-10-CM

## 2019-11-05 DIAGNOSIS — Z3A13 13 weeks gestation of pregnancy: Secondary | ICD-10-CM

## 2019-11-05 NOTE — Progress Notes (Signed)
[place] 8+3 wks,single IUP w/ys,positive fht 166 bpm,normal ovaries,crl 19.75 mm

## 2019-12-02 ENCOUNTER — Other Ambulatory Visit: Payer: Self-pay | Admitting: Obstetrics & Gynecology

## 2019-12-02 DIAGNOSIS — Z3682 Encounter for antenatal screening for nuchal translucency: Secondary | ICD-10-CM

## 2019-12-03 ENCOUNTER — Ambulatory Visit (INDEPENDENT_AMBULATORY_CARE_PROVIDER_SITE_OTHER): Payer: Self-pay

## 2019-12-03 ENCOUNTER — Encounter: Payer: Self-pay | Admitting: Women's Health

## 2019-12-03 ENCOUNTER — Ambulatory Visit: Payer: Self-pay | Admitting: *Deleted

## 2019-12-03 ENCOUNTER — Other Ambulatory Visit (HOSPITAL_COMMUNITY)
Admission: RE | Admit: 2019-12-03 | Discharge: 2019-12-03 | Disposition: A | Discharge status: Home / Self Care | Payer: Medicaid Other | Source: Ambulatory Visit | Attending: Obstetrics & Gynecology | Admitting: Obstetrics & Gynecology

## 2019-12-03 ENCOUNTER — Ambulatory Visit (INDEPENDENT_AMBULATORY_CARE_PROVIDER_SITE_OTHER): Payer: Self-pay | Admitting: Women's Health

## 2019-12-03 ENCOUNTER — Other Ambulatory Visit: Payer: Self-pay

## 2019-12-03 VITALS — BP 104/67 | HR 75 | Wt 127.0 lb

## 2019-12-03 DIAGNOSIS — Z124 Encounter for screening for malignant neoplasm of cervix: Secondary | ICD-10-CM | POA: Insufficient documentation

## 2019-12-03 DIAGNOSIS — Z3490 Encounter for supervision of normal pregnancy, unspecified, unspecified trimester: Secondary | ICD-10-CM | POA: Insufficient documentation

## 2019-12-03 DIAGNOSIS — Z36 Encounter for antenatal screening for chromosomal anomalies: Secondary | ICD-10-CM

## 2019-12-03 DIAGNOSIS — Z0283 Encounter for blood-alcohol and blood-drug test: Secondary | ICD-10-CM

## 2019-12-03 DIAGNOSIS — Z331 Pregnant state, incidental: Secondary | ICD-10-CM | POA: Insufficient documentation

## 2019-12-03 DIAGNOSIS — Z1389 Encounter for screening for other disorder: Secondary | ICD-10-CM

## 2019-12-03 DIAGNOSIS — Z3481 Encounter for supervision of other normal pregnancy, first trimester: Secondary | ICD-10-CM

## 2019-12-03 DIAGNOSIS — Z1379 Encounter for other screening for genetic and chromosomal anomalies: Secondary | ICD-10-CM

## 2019-12-03 DIAGNOSIS — Z3682 Encounter for antenatal screening for nuchal translucency: Secondary | ICD-10-CM

## 2019-12-03 DIAGNOSIS — Z3A12 12 weeks gestation of pregnancy: Secondary | ICD-10-CM | POA: Diagnosis not present

## 2019-12-03 DIAGNOSIS — O34219 Maternal care for unspecified type scar from previous cesarean delivery: Secondary | ICD-10-CM

## 2019-12-03 DIAGNOSIS — Z23 Encounter for immunization: Secondary | ICD-10-CM

## 2019-12-03 DIAGNOSIS — Z363 Encounter for antenatal screening for malformations: Secondary | ICD-10-CM

## 2019-12-03 MED ORDER — BLOOD PRESSURE MONITOR MISC: 0 refills | Status: DC

## 2019-12-03 NOTE — Progress Notes (Signed)
[place] 12+3 wks,measurements c/w dates,crl 62.77 mm,fhr 157 bpm,normal ovaries,NB present,NT 1.5 mm

## 2019-12-03 NOTE — Patient Instructions (Addendum)
[name], I greatly value your feedback.  If you receive a survey following your visit with [place] today, we appreciate you taking the time to fill it out.  Thanks, Joellyn Haff, CNM, [facility]  [facility] H[facility] HAS MOVED!!! It is now [facility] at [facility] ([address] [place], [place] [address]) Entrance located off of [address] Free 24/7 valet parking   Vitamin B6 (pyridoxine) 25mg  four times a day OR 50mg  twice a day Add Unisom (doxylamine) 12.5mg  (1/2 tab) in the morning, 12.5mg  6-8 hours later, then 25mg  every night if symptoms do not improve with Vitamin B6 alone    Nausea & Vomiting  Have saltine crackers or pretzels by your bed and eat a few bites before you raise your head out of bed in the morning  Eat small frequent meals throughout the day instead of large meals  Drink plenty of fluids throughout the day to stay hydrated, just don't drink a lot of fluids with your meals.  This can make your stomach fill up faster making you feel sick  Do not brush your teeth right after you eat  Products with real ginger are good for nausea, like ginger ale and ginger hard candy Make sure it says made with real ginger!  Sucking on sour candy like lemon heads is also good for nausea  If your prenatal vitamins make you nauseated, take them at night so you will sleep through the nausea  Sea Bands  If you feel like you need medicine for the nausea & vomiting please let know  If you are unable to keep any fluids or food down please let know   Constipation  Drink plenty of fluid, preferably water, throughout the day  Eat foods high in fiber such as fruits, vegetables, and grains  Exercise, such as walking, is a good way to keep your bowels regular  Drink warm fluids, especially warm prune juice, or decaf coffee  Eat a 1/2 cup of real oatmeal (not instant), 1/2 cup applesauce, and 1/2-1 cup warm prune juice every day  If needed, you may take Colace  (docusate sodium) stool softener once or twice a day to help keep the stool soft.   If you still are having problems with constipation, you may take Miralax once daily as needed to help keep your bowels regular.   Home Blood Pressure Monitoring for Patients   Your provider has recommended that you check your blood pressure (BP) at least once a week at home. If you do not have a blood pressure cuff at home, one will be provided for you. Contact your provider if you have not received your monitor within 1 week.   Helpful Tips for Accurate Home Blood Pressure Checks  . Don't smoke, exercise, or drink caffeine 30 minutes before checking your BP . Use the restroom before checking your BP (a full bladder can raise your pressure) . Relax in a comfortable upright chair . Feet on the ground . Left arm resting comfortably on a flat surface at the level of your heart . Legs uncrossed . Back supported . Sit quietly and don't talk . Place the cuff on your bare arm . Adjust snuggly, so that only two fingertips can fit between your skin and the top of the cuff . Check 2 readings separated by at least one minute . Keep a log of your BP readings . For a visual, please reference this diagram: http://ccnc.care/bpdiagram  Provider Name: [facility]     Phone: [phone]  Zone 1: ALL CLEAR  Continue to monitor your symptoms:  . BP reading is less than 140 (top number) or less than 90 (bottom number)  . No right upper stomach pain . No headaches or seeing spots . No feeling nauseated or throwing up . No swelling in face and hands  Zone 2: CAUTION Call your doctor's office for any of the following:  . BP reading is greater than 140 (top number) or greater than 90 (bottom number)  . Stomach pain under your ribs in the middle or right side . Headaches or seeing spots . Feeling nauseated or throwing up . Swelling in face and hands  Zone 3: EMERGENCY  Seek immediate medical care if you have  any of the following:  . BP reading is greater than160 (top number) or greater than 110 (bottom number) . Severe headaches not improving with Tylenol . Serious difficulty catching your breath . Any worsening symptoms from Zone 2    First Trimester of Pregnancy The first trimester of pregnancy is from week 1 until the end of week 12 (months 1 through 3). A week after a sperm fertilizes an egg, the egg will implant on the wall of the uterus. This embryo will begin to develop into a baby. Genes from you and your partner are forming the baby. The female genes determine whether the baby is a boy or a girl. At 6-8 weeks, the eyes and face are formed, and the heartbeat can be seen on ultrasound. At the end of 12 weeks, all the baby's organs are formed.  Now that you are pregnant, you will want to do everything you can to have a healthy baby. Two of the most important things are to get good prenatal care and to follow your health care provider's instructions. Prenatal care is all the medical care you receive before the baby's birth. This care will help prevent, find, and treat any problems during the pregnancy and childbirth. BODY CHANGES Your body goes through many changes during pregnancy. The changes vary from woman to woman.   You may gain or lose a couple of pounds at first.  You may feel sick to your stomach (nauseous) and throw up (vomit). If the vomiting is uncontrollable, call your health care provider.  You may tire easily.  You may develop headaches that can be relieved by medicines approved by your health care provider.  You may urinate more often. Painful urination may mean you have a bladder infection.  You may develop heartburn as a result of your pregnancy.  You may develop constipation because certain hormones are causing the muscles that push waste through your intestines to slow down.  You may develop hemorrhoids or swollen, bulging veins (varicose veins).  Your breasts may begin  to grow larger and become tender. Your nipples may stick out more, and the tissue that surrounds them (areola) may become darker.  Your gums may bleed and may be sensitive to brushing and flossing.  Dark spots or blotches (chloasma, mask of pregnancy) may develop on your face. This will likely fade after the baby is born.  Your menstrual periods will stop.  You may have a loss of appetite.  You may develop cravings for certain kinds of food.  You may have changes in your emotions from day to day, such as being excited to be pregnant or being concerned that something may go wrong with the pregnancy and baby.  You  may have more vivid and strange dreams.  You may have changes in your hair. These can include thickening of your hair, rapid growth, and changes in texture. Some women also have hair loss during or after pregnancy, or hair that feels dry or thin. Your hair will most likely return to normal after your baby is born. WHAT TO EXPECT AT YOUR PRENATAL VISITS During a routine prenatal visit:  You will be weighed to make sure you and the baby are growing normally.  Your blood pressure will be taken.  Your abdomen will be measured to track your baby's growth.  The fetal heartbeat will be listened to starting around week 10 or 12 of your pregnancy.  Test results from any previous visits will be discussed. Your health care provider may ask you:  How you are feeling.  If you are feeling the baby move.  If you have had any abnormal symptoms, such as leaking fluid, bleeding, severe headaches, or abdominal cramping.  If you have any questions. Other tests that may be performed during your first trimester include:  Blood tests to find your blood type and to check for the presence of any previous infections. They will also be used to check for low iron levels (anemia) and Rh antibodies. Later in the pregnancy, blood tests for diabetes will be done along with other tests if problems  develop.  Urine tests to check for infections, diabetes, or protein in the urine.  An ultrasound to confirm the proper growth and development of the baby.  An amniocentesis to check for possible genetic problems.  Fetal screens for spina bifida and Down syndrome.  You may need other tests to make sure you and the baby are doing well. HOME CARE INSTRUCTIONS  Medicines  Follow your health care provider's instructions regarding medicine use. Specific medicines may be either safe or unsafe to take during pregnancy.  Take your prenatal vitamins as directed.  If you develop constipation, try taking a stool softener if your health care provider approves. Diet  Eat regular, well-balanced meals. Choose a variety of foods, such as meat or vegetable-based protein, fish, milk and low-fat dairy products, vegetables, fruits, and whole grain breads and cereals. Your health care provider will help you determine the amount of weight gain that is right for you.  Avoid raw meat and uncooked cheese. These carry germs that can cause birth defects in the baby.  Eating four or five small meals rather than three large meals a day may help relieve nausea and vomiting. If you start to feel nauseous, eating a few soda crackers can be helpful. Drinking liquids between meals instead of during meals also seems to help nausea and vomiting.  If you develop constipation, eat more high-fiber foods, such as fresh vegetables or fruit and whole grains. Drink enough fluids to keep your urine clear or pale yellow. Activity and Exercise  Exercise only as directed by your health care provider. Exercising will help you:  Control your weight.  Stay in shape.  Be prepared for labor and delivery.  Experiencing pain or cramping in the lower abdomen or low back is a good sign that you should stop exercising. Check with your health care provider before continuing normal exercises.  Try to avoid standing for long periods of  time. Move your legs often if you must stand in one place for a long time.  Avoid heavy lifting.  Wear low-heeled shoes, and practice good posture.  You may continue to have sex  unless your health care provider directs you otherwise. Relief of Pain or Discomfort  Wear a good support bra for breast tenderness.    Take warm sitz baths to soothe any pain or discomfort caused by hemorrhoids. Use hemorrhoid cream if your health care provider approves.    Rest with your legs elevated if you have leg cramps or low back pain.  If you develop varicose veins in your legs, wear support hose. Elevate your feet for 15 minutes, 3-4 times a day. Limit salt in your diet. Prenatal Care  Schedule your prenatal visits by the twelfth week of pregnancy. They are usually scheduled monthly at first, then more often in the last 2 months before delivery.  Write down your questions. Take them to your prenatal visits.  Keep all your prenatal visits as directed by your health care provider. Safety  Wear your seat belt at all times when driving.  Make a list of emergency phone numbers, including numbers for family, friends, the hospital, and police and fire departments. General Tips  Ask your health care provider for a referral to a local prenatal education class. Begin classes no later than at the beginning of month 6 of your pregnancy.  Ask for help if you have counseling or nutritional needs during pregnancy. Your health care provider can offer advice or refer you to specialists for help with various needs.  Do not use hot tubs, steam rooms, or saunas.  Do not douche or use tampons or scented sanitary pads.  Do not cross your legs for long periods of time.  Avoid cat litter boxes and soil used by cats. These carry germs that can cause birth defects in the baby and possibly loss of the fetus by miscarriage or stillbirth.  Avoid all smoking, herbs, alcohol, and medicines not prescribed by your health  care provider. Chemicals in these affect the formation and growth of the baby.  Schedule a dentist appointment. At home, brush your teeth with a soft toothbrush and be gentle when you floss. SEEK MEDICAL CARE IF:   You have dizziness.  You have mild pelvic cramps, pelvic pressure, or nagging pain in the abdominal area.  You have persistent nausea, vomiting, or diarrhea.  You have a bad smelling vaginal discharge.  You have pain with urination.  You notice increased swelling in your face, hands, legs, or ankles. SEEK IMMEDIATE MEDICAL CARE IF:   You have a fever.  You are leaking fluid from your vagina.  You have spotting or bleeding from your vagina.  You have severe abdominal cramping or pain.  You have rapid weight gain or loss.  You vomit blood or material that looks like coffee grounds.  You are exposed to [place] measles and have never had them.  You are exposed[place] fifth disease or chickenpox.  You develop a severe headache.  You have shortness of breath.  You have any kind of trauma, such as from a fall or a car accident. Document Released: 12/06/2001 Document Revised: 04/28/2014 Document Reviewed: 10/22/2013 [facility] Patient Information 2015 [place], [place]. This information is not intended to replace advice given to you by your health care provider. Make sure you discuss any questions you have with your health care provider.  Coronavirus (COVID-19) Are you at risk?  Are you at risk for the Coronavirus (COVID-19)?  To be considered HIGH RISK for Coronavirus (COVID-19), you have to meet the following criteria:  . Traveled to [place], [place], [place], [place] or [place]; or in [place]  States to [place], [place], [place], or [place]; and have fever, cough, and shortness of breath within the last 2 weeks of travel OR . Been in close contact with a person diagnosed with COVID-19 within the last 2 weeks and have fever, cough, and shortness of breath . IF  YOU DO NOT MEET THESE CRITERIA, YOU ARE CONSIDERED LOW RISK FOR COVID-19.  What to do if you are HIGH RISK for COVID-19?  Marland Kitchen If you are having a medical emergency, call 911. . Seek medical care right away. Before you go to a doctor's office, urgent care or emergency department, call ahead and tell them about your recent travel, contact with someone diagnosed with COVID-19, and your symptoms. You should receive instructions from your physician's office regarding next steps of care.  . When you arrive at healthcare provider, tell the healthcare staff immediately you have returned from visiting [place], [place], [place], [place] or [place]; or traveled in the [place] to [place], [place], [place], or [place]; in the last two weeks or you have been in close contact with a person diagnosed with COVID-19 in the last 2 weeks.   . Tell the health care staff about your symptoms: fever, cough and shortness of breath. . After you have been seen by a medical provider, you will be either: o Tested for (COVID-19) and discharged home on quarantine except to seek medical care if symptoms worsen, and asked to  - Stay home and avoid contact with others until you get your results (4-5 days)  - Avoid travel on public transportation if possible (such as bus, train, or airplane) or o Sent to the Emergency Department by EMS for evaluation, COVID-19 testing, and possible admission depending on your condition and test results.  What to do if you are LOW RISK for COVID-19?  Reduce your risk of any infection by using the same precautions used for avoiding the common cold or flu:  Marland Kitchen Wash your hands often with soap and warm water for at least 20 seconds.  If soap and water are not readily available, use an alcohol-based hand sanitizer with at least 60% alcohol.  . If coughing or sneezing, cover your mouth and nose by coughing or sneezing into the elbow areas of your shirt or coat, into a tissue or into your sleeve  (not your hands). . Avoid shaking hands with others and consider head nods or verbal greetings only. . Avoid touching your eyes, nose, or mouth with unwashed hands.  . Avoid close contact with people who are sick. . Avoid places or events with large numbers of people in one location, like concerts or sporting events. . Carefully consider travel plans you have or are making. . If you are planning any travel outside or inside the [place], visit the CDC's Travelers' Health webpage for the latest health notices. . If you have some symptoms but not all symptoms, continue to monitor at home and seek medical attention if your symptoms worsen. . If you are having a medical emergency, call 911.   [place] / e-Visit: [email or web address]         [facility]: [facility]: [phone]                   [facility]: [phone]

## 2019-12-03 NOTE — Progress Notes (Signed)
INITIAL OBSTETRICAL VISIT Patient name: [name] MRN [id number]  Date of birth: [birth year] Chief Complaint:   Initial Prenatal Visit (nt/it)  History of Present Illness:   [name] is a 28 y.o. [id number] female at [redacted]w[redacted]d by 8wk u/s, with an Estimated Date of Delivery: 06/13/20 being seen today for her initial obstetrical visit.   Her obstetrical history is significant for SAB x1, c/s 2009 after failed forceps (8lb7oz), successful VBAC x 1 (7lb5oz), C/S 2016 for FTD (9lb4oz), Ectopic 2019.   Today she reports n/v, phenergan doesn't really help, only vomits once q am. Has applied for preg mcaid, doesn't have card/number yet.  Patient's last menstrual period was 08/02/2019 (approximate). Last pap 2015. Results were: normal Review of Systems:   Pertinent items are noted in HPI Denies cramping/contractions, leakage of fluid, vaginal bleeding, abnormal vaginal discharge w/ itching/odor/irritation, headaches, visual changes, shortness of breath, chest pain, abdominal pain, severe nausea/vomiting, or problems with urination or bowel movements unless otherwise stated above.  Pertinent History Reviewed:  Reviewed past medical,surgical, social, obstetrical and family history.  Reviewed problem list, medications and allergies. OB History  Gravida Para Term Preterm AB Living  6 3 3   2 3   SAB TAB Ectopic Multiple Live Births  1   1 0 3    # Outcome Date GA Lbr Len/2nd Weight Sex Delivery Anes PTL Lv  6 Current           5 Ectopic 08/04/18          4 Term 01/01/15 [redacted]w[redacted]d 20:28 / 02:51 9 lb 4.7 oz (4.216 kg) M CS-LTranv EPI N LIV     Complications: Failure to Progress in Second Stage  3 Term 05/24/10 [redacted]w[redacted]d  7 lb 5 oz (3.317 kg) F VBAC EPI  LIV  2 Term 06/22/08 [redacted]w[redacted]d  8 lb 7 oz (3.827 kg) M CS-Unspec EPI  LIV     Birth Comments: failed forceps  1 SAB            Physical Assessment:   Vitals:   12/03/19 1006  BP: 104/67  Pulse: 75  Weight: 127 lb (57.6 kg)  Body mass index is 25.65 kg/m.        Physical Examination:  General appearance - well appearing, and in no distress  Mental status - alert, oriented to person, place, and time  Psych:  She has a normal mood and affect  Skin - warm and dry, normal color, no suspicious lesions noted  Chest - effort normal, all lung fields clear to auscultation bilaterally  Heart - normal rate and regular rhythm  Abdomen - soft, nontender  Extremities:  No swelling or varicosities noted  Pelvic - VULVA: normal appearing vulva with no masses, tenderness or lesions  VAGINA: normal appearing vagina with normal color and discharge, no lesions  CERVIX: normal appearing cervix without discharge or lesions, no CMT  Thin prep pap is done w/ HR HPV cotesting  TODAY'S NT [place] 12+3 wks,measurements c/w dates,crl 62.77 mm,fhr 157 bpm,normal ovaries,NB present,NT 1.5 mm  No results found for this or any previous visit (from the past 24 hour(s)).  Assessment & Plan:  1) Low-Risk Pregnancy [id number] at [redacted]w[redacted]d with an Estimated Date of Delivery: 06/13/20   2) Initial OB visit  3) Prev C/S>VBAC>C/S- wants TOLAC if possible, consider EFW @ 36wks, able to birth 7lb5oz baby but not 8lb7oz or greater  4) N/V> otc unisom/vitB6 until mcaid active  Meds:  Meds ordered this encounter  Medications  .  Blood Pressure Monitor MISC    Sig: For regular home bp monitoring during pregnancy    Dispense:  1 each    Refill:  0    Z34.90    Initial labs obtained Continue prenatal vitamins Reviewed n/v relief measures and warning s/s to report Reviewed recommended weight gain based on pre-gravid BMI Encouraged well-balanced diet Genetic Screening discussed: requested nt/it, maternit21 Cystic fibrosis, SMA, Fragile X screening discussed declined Ultrasound discussed; fetal survey: requested CCNC completed>PCM not here, form faxed The nature of [place] [facility] [employer] with multiple MDs and other Advanced Practice Providers was explained to  patient; also emphasized that fellows, residents, and students are part of our team. Does not have home bp cuff. Rx faxed to [facility]. Check bp weekly, let us know if >140/90.  Flu shot today  Follow-up: Return in about 6 weeks (around 01/14/2020) for LROB, 2nd IT, [id number], in person, CNM.   Orders Placed This Encounter  Procedures  . Urine Culture  . US OB Comp + 14 Wk  . Urinalysis, Routine w reflex microscopic  . Pain Management Screening Profile (10S)  . Obstetric Panel, Including HIV  . Integrated 1  . MaterniT 21 plus Core, Blood  . POC Urinalysis Dipstick OB    Cheral Marker CNM, [facility] 12/03/2019 11:06 AM

## 2019-12-04 LAB — OBSTETRIC PANEL, INCLUDING HIV
Antibody Screen: NEGATIVE
Basophils Absolute: 0 10*3/uL (ref 0.0–0.2)
Basos: 0 %
EOS (ABSOLUTE): 0.1 10*3/uL (ref 0.0–0.4)
Eos: 1 %
HIV Screen 4th Generation wRfx: NONREACTIVE
Hematocrit: 36.9 % (ref 34.0–46.6)
Hemoglobin: 12.8 g/dL (ref 11.1–15.9)
Hepatitis B Surface Ag: NEGATIVE
Immature Grans (Abs): 0 10*3/uL (ref 0.0–0.1)
Immature Granulocytes: 0 %
Lymphocytes Absolute: 1.6 10*3/uL (ref 0.7–3.1)
Lymphs: 15 %
MCH: 31.4 pg (ref 26.6–33.0)
MCHC: 34.7 g/dL (ref 31.5–35.7)
MCV: 91 fL (ref 79–97)
Monocytes Absolute: 0.5 10*3/uL (ref 0.1–0.9)
Monocytes: 4 %
Neutrophils Absolute: 8.4 10*3/uL — ABNORMAL HIGH (ref 1.4–7.0)
Neutrophils: 80 %
Platelets: 263 10*3/uL (ref 150–450)
RBC: 4.07 x10E6/uL (ref 3.77–5.28)
RDW: 12.6 % (ref 11.7–15.4)
RPR Ser Ql: NONREACTIVE
Rh Factor: POSITIVE
Rubella Antibodies, IGG: 2.4 index (ref >0.99)
WBC: 10.6 10*3/uL (ref 3.4–10.8)

## 2019-12-04 LAB — PMP SCREEN PROFILE (10S), URINE
Amphetamine Scrn, Ur: NEGATIVE ng/mL
BARBITURATE SCREEN URINE: NEGATIVE ng/mL
BENZODIAZEPINE SCREEN, URINE: NEGATIVE ng/mL
CANNABINOIDS UR QL SCN: POSITIVE ng/mL — AB
Cocaine (Metab) Scrn, Ur: NEGATIVE ng/mL
Creatinine(Crt), U: 77.9 mg/dL (ref 20.0–300.0)
Methadone Screen, Urine: NEGATIVE ng/mL
OXYCODONE+OXYMORPHONE UR QL SCN: NEGATIVE ng/mL
Opiate Scrn, Ur: NEGATIVE ng/mL
Ph of Urine: 7.5 (ref 4.5–8.9)
Phencyclidine Qn, Ur: NEGATIVE ng/mL
Propoxyphene Scrn, Ur: NEGATIVE ng/mL

## 2019-12-04 LAB — URINALYSIS, ROUTINE W REFLEX MICROSCOPIC
Bilirubin, UA: NEGATIVE
Glucose, UA: NEGATIVE
Ketones, UA: NEGATIVE
Leukocytes,UA: NEGATIVE
Nitrite, UA: NEGATIVE
Protein,UA: NEGATIVE
RBC, UA: NEGATIVE
Specific Gravity, UA: 1.021 (ref 1.005–1.030)
Urobilinogen, Ur: 0.2 mg/dL (ref 0.2–1.0)
pH, UA: 7.5 (ref 5.0–7.5)

## 2019-12-05 ENCOUNTER — Encounter: Payer: Self-pay | Admitting: Women's Health

## 2019-12-05 DIAGNOSIS — F1290 Cannabis use, unspecified, uncomplicated: Secondary | ICD-10-CM | POA: Insufficient documentation

## 2019-12-05 LAB — MATERNIT 21 PLUS CORE, BLOOD

## 2019-12-05 LAB — CYTOLOGY - PAP
Chlamydia: NEGATIVE
Comment: NEGATIVE
Comment: NEGATIVE
Comment: NORMAL
Diagnosis: NEGATIVE
High risk HPV: NEGATIVE
Neisseria Gonorrhea: NEGATIVE

## 2019-12-05 LAB — URINE CULTURE

## 2019-12-05 LAB — INTEGRATED 1
Maternal Age at EDD: 28.7 yr
PAPP-A Value: 1325.6 ng/mL

## 2019-12-10 LAB — MATERNIT 21 PLUS CORE, BLOOD
Fetal Fraction: 9
Result (T21): NEGATIVE
Trisomy 13 (Patau syndrome): NEGATIVE
Trisomy 18 (Edwards syndrome): NEGATIVE
Trisomy 21 (Down syndrome): NEGATIVE

## 2019-12-10 LAB — INTEGRATED 1
Crown Rump Length: 62.8 mm
Gest. Age on Collection Date: 12.6 weeks
Nuchal Translucency (NT): 1.5 mm
Number of Fetuses: 1
Weight: 127 [lb_av]

## 2019-12-17 ENCOUNTER — Other Ambulatory Visit: Payer: Self-pay | Admitting: Adult Health

## 2020-01-14 ENCOUNTER — Encounter: Payer: Self-pay | Admitting: Obstetrics & Gynecology

## 2020-01-14 ENCOUNTER — Other Ambulatory Visit: Payer: Self-pay

## 2020-01-14 ENCOUNTER — Ambulatory Visit (INDEPENDENT_AMBULATORY_CARE_PROVIDER_SITE_OTHER): Payer: Self-pay

## 2020-01-14 ENCOUNTER — Ambulatory Visit (INDEPENDENT_AMBULATORY_CARE_PROVIDER_SITE_OTHER): Payer: Self-pay | Admitting: Obstetrics & Gynecology

## 2020-01-14 VITALS — BP 101/64 | HR 74 | Wt 139.5 lb

## 2020-01-14 DIAGNOSIS — Z3A18 18 weeks gestation of pregnancy: Secondary | ICD-10-CM

## 2020-01-14 DIAGNOSIS — Z1389 Encounter for screening for other disorder: Secondary | ICD-10-CM

## 2020-01-14 DIAGNOSIS — Z363 Encounter for antenatal screening for malformations: Secondary | ICD-10-CM

## 2020-01-14 DIAGNOSIS — Z331 Pregnant state, incidental: Secondary | ICD-10-CM

## 2020-01-14 DIAGNOSIS — Z3482 Encounter for supervision of other normal pregnancy, second trimester: Secondary | ICD-10-CM

## 2020-01-14 DIAGNOSIS — Z3481 Encounter for supervision of other normal pregnancy, first trimester: Secondary | ICD-10-CM

## 2020-01-14 DIAGNOSIS — Z1379 Encounter for other screening for genetic and chromosomal anomalies: Secondary | ICD-10-CM

## 2020-01-14 LAB — POCT URINALYSIS DIPSTICK OB
Blood, UA: NEGATIVE
Glucose, UA: NEGATIVE
Ketones, UA: NEGATIVE
Leukocytes, UA: NEGATIVE
Nitrite, UA: NEGATIVE
POC,PROTEIN,UA: NEGATIVE

## 2020-01-14 NOTE — Progress Notes (Signed)
   LOW-RISK PREGNANCY VISIT Patient name: [name] MRN [id number]  Date of birth: [birth year] Chief Complaint:   Routine Prenatal Visit ([place] and 2nd IT today)  History of Present Illness:   [name] is a 29 y.o. [phone] female at [redacted]w[redacted]d with an Estimated Date of Delivery: 06/13/20 being seen today for ongoing management of a low-risk pregnancy.  Today she reports no complaints. Contractions: Not present. Vag. Bleeding: None.  Movement: Present. denies leaking of fluid. Review of Systems:   Pertinent items are noted in HPI Denies abnormal vaginal discharge w/ itching/odor/irritation, headaches, visual changes, shortness of breath, chest pain, abdominal pain, severe nausea/vomiting, or problems with urination or bowel movements unless otherwise stated above. Pertinent History Reviewed:  Reviewed past medical,surgical, social, obstetrical and family history.  Reviewed problem list, medications and allergies. Physical Assessment:   Vitals:   01/14/20 1040  BP: 101/64  Pulse: 74  Weight: 139 lb 8 oz (63.3 kg)  Body mass index is 28.18 kg/m.        Physical Examination:   General appearance: Well appearing, and in no distress  Mental status: Alert, oriented to person, place, and time  Skin: Warm & dry  Cardiovascular: Normal heart rate noted  Respiratory: Normal respiratory effort, no distress  Abdomen: Soft, gravid, nontender  Pelvic: Cervical exam deferred         Extremities: Edema: Trace  Fetal Status:     Movement: Present    Chaperone: n/a    Results for orders placed or performed in visit on 01/14/20 (from the past 24 hour(s))  POC Urinalysis Dipstick OB   Collection Time: 01/14/20 10:41 AM  Result Value Ref Range   Color, UA     Clarity, UA     Glucose, UA Negative Negative   Bilirubin, UA     Ketones, UA neg    Spec Grav, UA     Blood, UA neg    pH, UA     POC,PROTEIN,UA Negative Negative, Trace, Small (1+), Moderate (2+), Large (3+), 4+   Urobilinogen, UA      Nitrite, UA neg    Leukocytes, UA Negative Negative   Appearance     Odor      Assessment & Plan:  1) Low-risk pregnancy [id number] at [redacted]w[redacted]d with an Estimated Date of Delivery: 06/13/20   2) , sonogram normal   Meds: No orders of the defined types were placed in this encounter.  Labs/procedures today:   Plan:  Continue routine obstetrical care  Next visit: prefers online    Reviewed: Preterm labor symptoms and general obstetric precautions including but not limited to vaginal bleeding, contractions, leaking of fluid and fetal movement were reviewed in detail with the patient.  All questions were answered. has home bp cuff. Rx faxed to . Check bp weekly, let us know if >140/90.   Follow-up: Return in about 4 weeks (around 02/11/2020) for MyChart Connect visit, LROB.  Orders Placed This Encounter  Procedures  . INTEGRATED 2  . POC Urinalysis Dipstick OB   Amaryllis Dyke Eure  01/14/2020 11:21 AM

## 2020-01-14 NOTE — Progress Notes (Signed)
[place] 18+3 wks,cephalic,posterior placenta gr 0,cx 2.9 cm,normal ovaries bilat,fhr 150 bpm,svp of fluid 5.4 cm,efw 245 g 51 %,anatomy complete,no obvious abnormalities

## 2020-01-16 LAB — INTEGRATED 2
AFP MoM: 1.16
Alpha-Fetoprotein: 53.7 ng/mL
Crown Rump Length: 62.8 mm
DIA MoM: 1.29
DIA Value: 227.2 pg/mL
Estriol, Unconjugated: 2.25 ng/mL
Gest. Age on Collection Date: 12.6 weeks
Gestational Age: 18.6 weeks
Maternal Age at EDD: 28.7 yr
Nuchal Translucency (NT): 1.5 mm
Nuchal Translucency MoM: 1.07
Number of Fetuses: 1
PAPP-A MoM: 1.15
PAPP-A Value: 1325.6 ng/mL
Test Results:: NEGATIVE
Weight: 127 [lb_av]
Weight: 140 [lb_av]
hCG MoM: 1.87
hCG Value: 46.2 IU/mL
uE3 MoM: 1.34

## 2020-02-11 ENCOUNTER — Encounter: Payer: Medicaid Other | Admitting: Advanced Practice Midwife

## 2020-02-11 ENCOUNTER — Other Ambulatory Visit: Payer: Self-pay

## 2020-02-12 ENCOUNTER — Other Ambulatory Visit: Payer: Self-pay

## 2020-02-12 ENCOUNTER — Ambulatory Visit (INDEPENDENT_AMBULATORY_CARE_PROVIDER_SITE_OTHER): Payer: Self-pay | Admitting: Advanced Practice Midwife

## 2020-02-12 DIAGNOSIS — Z3A22 22 weeks gestation of pregnancy: Secondary | ICD-10-CM

## 2020-02-12 DIAGNOSIS — Z3482 Encounter for supervision of other normal pregnancy, second trimester: Secondary | ICD-10-CM

## 2020-02-12 NOTE — Patient Instructions (Signed)
[name], I greatly value your feedback.  If you receive a survey following your visit with [place] today, we appreciate you taking the time to fill it out.  Thanks, [name], CNM  [facility] HAS MOVED!!! It is now [facility] at [facility] ([address] [place], [place] [address]) Entrance located off of [address] Free 24/7 valet parking   Go to [email or web address] to register for FREE online childbirth classes  [place] Pediatricians/Family Doctors:  [facility] [phone]            [facility] [phone]                 [facility] [phone] (usually not accepting new patients unless you have family there already, you are always welcome to call and ask)       [facility] Department [phone]       [facility] Pediatricians/Family Doctors:   [facility]: [phone]  [facility]: [phone]  [facility]: [phone]  [facility] Doctors:   [facility]: [phone]   [facility]: [phone]  [facility] Doctors:  [facility]: [phone]    Home Blood Pressure Monitoring for Patients   Your provider has recommended that you check your blood pressure (BP) at least once a week at home. If you do not have a blood pressure cuff at home, one will be provided for you. Contact your provider if you have not received your monitor within 1 week.   Helpful Tips for Accurate Home Blood Pressure Checks  . Don't smoke, exercise, or drink caffeine 30 minutes before checking your BP . Use the restroom before checking your BP (a full bladder can raise your pressure) . Relax in a comfortable upright chair . Feet on the ground . Left arm resting comfortably on a flat surface at the level of your heart . Legs uncrossed . Back supported . Sit quietly and don't talk . Place the cuff on your bare  arm . Adjust snuggly, so that only two fingertips can fit between your skin and the top of the cuff . Check 2 readings separated by at least one minute . Keep a log of your BP readings . For a visual, please reference this diagram: http://ccnc.care/bpdiagram  Provider Name: [facility]     Phone: [phone]  Zone 1: ALL CLEAR  Continue to monitor your symptoms:  . BP reading is less than 140 (top number) or less than 90 (bottom number)  . No right upper stomach pain . No headaches or seeing spots . No feeling nauseated or throwing up . No swelling in face and hands  Zone 2: CAUTION Call your doctor's office for any of the following:  . BP reading is greater than 140 (top number) or greater than 90 (bottom number)  . Stomach pain under your ribs in the middle or right side . Headaches or seeing spots . Feeling nauseated or throwing up . Swelling in face and hands  Zone 3: EMERGENCY  Seek immediate medical care if you have any of the following:  . BP reading is greater than160 (top number) or greater than 110 (bottom number) . Severe headaches not improving with Tylenol . Serious difficulty catching your breath . Any worsening symptoms from Zone 2     Second Trimester of Pregnancy The second trimester is from week 14 through week 27 (months 4 through 6). The second trimester is often a  time when you feel your best. Your body has adjusted to being pregnant, and you begin to feel better physically. Usually, morning sickness has lessened or quit completely, you may have more energy, and you may have an increase in appetite. The second trimester is also a time when the fetus is growing rapidly. At the end of the sixth month, the fetus is about 9 inches long and weighs about 1 pounds. You will likely begin to feel the baby move (quickening) between 16 and 20 weeks of pregnancy. Body changes during your second trimester Your body continues to go through many changes during your  second trimester. The changes vary from woman to woman.  Your weight will continue to increase. You will notice your lower abdomen bulging out.  You may begin to get stretch marks on your hips, abdomen, and breasts.  You may develop headaches that can be relieved by medicines. The medicines should be approved by your health care provider.  You may urinate more often because the fetus is pressing on your bladder.  You may develop or continue to have heartburn as a result of your pregnancy.  You may develop constipation because certain hormones are causing the muscles that push waste through your intestines to slow down.  You may develop hemorrhoids or swollen, bulging veins (varicose veins).  You may have back pain. This is caused by: ? Weight gain. ? Pregnancy hormones that are relaxing the joints in your pelvis. ? A shift in weight and the muscles that support your balance.  Your breasts will continue to grow and they will continue to become tender.  Your gums may bleed and may be sensitive to brushing and flossing.  Dark spots or blotches (chloasma, mask of pregnancy) may develop on your face. This will likely fade after the baby is born.  A dark line from your belly button to the pubic area (linea nigra) may appear. This will likely fade after the baby is born.  You may have changes in your hair. These can include thickening of your hair, rapid growth, and changes in texture. Some women also have hair loss during or after pregnancy, or hair that feels dry or thin. Your hair will most likely return to normal after your baby is born.  What to expect at prenatal visits During a routine prenatal visit:  You will be weighed to make sure you and the fetus are growing normally.  Your blood pressure will be taken.  Your abdomen will be measured to track your baby's growth.  The fetal heartbeat will be listened to.  Any test results from the previous visit will be  discussed.  Your health care provider may ask you:  How you are feeling.  If you are feeling the baby move.  If you have had any abnormal symptoms, such as leaking fluid, bleeding, severe headaches, or abdominal cramping.  If you are using any tobacco products, including cigarettes, chewing tobacco, and electronic cigarettes.  If you have any questions.  Other tests that may be performed during your second trimester include:  Blood tests that check for: ? Low iron levels (anemia). ? High blood sugar that affects pregnant women (gestational diabetes) between 6 and 28 weeks. ? Rh antibodies. This is to check for a protein on red blood cells (Rh factor).  Urine tests to check for infections, diabetes, or protein in the urine.  An ultrasound to confirm the proper growth and development of the baby.  An amniocentesis to check for  possible genetic problems.  Fetal screens for spina bifida and Down syndrome.  HIV (human immunodeficiency virus) testing. Routine prenatal testing includes screening for HIV, unless you choose not to have this test.  Follow these instructions at home: Medicines  Follow your health care provider's instructions regarding medicine use. Specific medicines may be either safe or unsafe to take during pregnancy.  Take a prenatal vitamin that contains at least 600 micrograms (mcg) of folic acid.  If you develop constipation, try taking a stool softener if your health care provider approves. Eating and drinking  Eat a balanced diet that includes fresh fruits and vegetables, whole grains, good sources of protein such as meat, eggs, or tofu, and low-fat dairy. Your health care provider will help you determine the amount of weight gain that is right for you.  Avoid raw meat and uncooked cheese. These carry germs that can cause birth defects in the baby.  If you have low calcium intake from food, talk to your health care provider about whether you should take a  daily calcium supplement.  Limit foods that are high in fat and processed sugars, such as fried and sweet foods.  To prevent constipation: ? Drink enough fluid to keep your urine clear or pale yellow. ? Eat foods that are high in fiber, such as fresh fruits and vegetables, whole grains, and beans. Activity  Exercise only as directed by your health care provider. Most women can continue their usual exercise routine during pregnancy. Try to exercise for 30 minutes at least 5 days a week. Stop exercising if you experience uterine contractions.  Avoid heavy lifting, wear low heel shoes, and practice good posture.  A sexual relationship may be continued unless your health care provider directs you otherwise. Relieving pain and discomfort  Wear a good support bra to prevent discomfort from breast tenderness.  Take warm sitz baths to soothe any pain or discomfort caused by hemorrhoids. Use hemorrhoid cream if your health care provider approves.  Rest with your legs elevated if you have leg cramps or low back pain.  If you develop varicose veins, wear support hose. Elevate your feet for 15 minutes, 3-4 times a day. Limit salt in your diet. Prenatal Care  Write down your questions. Take them to your prenatal visits.  Keep all your prenatal visits as told by your health care provider. This is important. Safety  Wear your seat belt at all times when driving.  Make a list of emergency phone numbers, including numbers for family, Ortiz, the hospital, and police and fire departments. General instructions  Ask your health care provider for a referral to a local prenatal education class. Begin classes no later than the beginning of month 6 of your pregnancy.  Ask for help if you have counseling or nutritional needs during pregnancy. Your health care provider can offer advice or refer you to specialists for help with various needs.  Do not use hot tubs, steam rooms, or saunas.  Do not  douche or use tampons or scented sanitary pads.  Do not cross your legs for long periods of time.  Avoid cat litter boxes and soil used by cats. These carry germs that can cause birth defects in the baby and possibly loss of the fetus by miscarriage or stillbirth.  Avoid all smoking, herbs, alcohol, and unprescribed drugs. Chemicals in these products can affect the formation and growth of the baby.  Do not use any products that contain nicotine or tobacco, such as cigarettes and  e-cigarettes. If you need help quitting, ask your health care provider.  Visit your dentist if you have not gone yet during your pregnancy. Use a soft toothbrush to brush your teeth and be gentle when you floss. Contact a health care provider if:  You have dizziness.  You have mild pelvic cramps, pelvic pressure, or nagging pain in the abdominal area.  You have persistent nausea, vomiting, or diarrhea.  You have a bad smelling vaginal discharge.  You have pain when you urinate. Get help right away if:  You have a fever.  You are leaking fluid from your vagina.  You have spotting or bleeding from your vagina.  You have severe abdominal cramping or pain.  You have rapid weight gain or weight loss.  You have shortness of breath with chest pain.  You notice sudden or extreme swelling of your face, hands, ankles, feet, or legs.  You have not felt your baby move in over an hour.  You have severe headaches that do not go away when you take medicine.  You have vision changes. Summary  The second trimester is from week 14 through week 27 (months 4 through 6). It is also a time when the fetus is growing rapidly.  Your body goes through many changes during pregnancy. The changes vary from woman to woman.  Avoid all smoking, herbs, alcohol, and unprescribed drugs. These chemicals affect the formation and growth your baby.  Do not use any tobacco products, such as cigarettes, chewing tobacco, and  e-cigarettes. If you need help quitting, ask your health care provider.  Contact your health care provider if you have any questions. Keep all prenatal visits as told by your health care provider. This is important. This information is not intended to replace advice given to you by your health care provider. Make sure you discuss any questions you have with your health care provider. Document Released: 12/06/2001 Document Revised: 05/19/2016 Document Reviewed: 02/12/2013 Elsevier Interactive Patient Education  2017 [place] FLU! Because you are pregnant, we at [facility], along with the Centers for Disease Control (CDC), recommend that you receive the flu vaccine to protect yourself and your baby from the flu. The flu is more likely to cause severe illness in pregnant women than in women of reproductive age who are not pregnant. Changes in the immune system, heart, and lungs during pregnancy make pregnant women (and women up to two weeks postpartum) more prone to severe illness from flu, including illness resulting in hospitalization. Flu also may be harmful for a pregnant woman's developing baby. A common flu symptom is fever, which may be associated with neural tube defects and other adverse outcomes for a developing baby. Getting vaccinated can also help protect a baby after birth from flu. (Mom passes antibodies onto the developing baby during her pregnancy.)  A Flu Vaccine is the Best Protection Against Flu Getting a flu vaccine is the first and most important step in protecting against flu. Pregnant women should get a flu shot and not the live attenuated influenza vaccine (LAIV), also known as nasal spray flu vaccine. Flu vaccines given during pregnancy help protect both the mother and her baby from flu. Vaccination has been shown to reduce the risk of flu-associated acute respiratory infection in pregnant women by up to one-half. A 2018 study showed  that getting a flu shot reduced a pregnant woman's risk of being hospitalized with flu by an average of 40 percent.  Pregnant women who get a flu vaccine are also helping to protect their babies from flu illness for the first several months after their birth, when they are too young to get vaccinated.   A Long Record of Safety for Flu Shots in Pregnant Women Flu shots have been given to millions of pregnant women over many years with a good safety record. There is a lot of evidence that flu vaccines can be given safely during pregnancy; though these data are limited for the first trimester. The CDC recommends that pregnant women get vaccinated during any trimester of their pregnancy. It is very important for pregnant women to get the flu shot.   Other Preventive Actions In addition to getting a flu shot, pregnant women should take the same everyday preventive actions the CDC recommends of everyone, including covering coughs, washing hands often, and avoiding people who are sick.  Symptoms and Treatment If you get sick with flu symptoms call your doctor right away. There are antiviral drugs that can treat flu illness and prevent serious flu complications. The CDC recommends prompt treatment for people who have influenza infection or suspected influenza infection and who are at high risk of serious flu complications, such as people with asthma, diabetes (including gestational diabetes), or heart disease. Early treatment of influenza in hospitalized pregnant women has been shown to reduce the length of the hospital stay.  Symptoms Flu symptoms include fever, cough, sore throat, runny or stuffy nose, body aches, headache, chills and fatigue. Some people may also have vomiting and diarrhea. People may be infected with the flu and have respiratory symptoms without a fever.  Early Treatment is Important for Pregnant Women Treatment should begin as soon as possible because antiviral drugs work best when  started early (within 48 hours after symptoms start). Antiviral drugs can make your flu illness milder and make you feel better faster. They may also prevent serious health problems that can result from flu illness. Oral oseltamivir (Tamiflu) is the preferred treatment for pregnant women because it has the most studies available to suggest that it is safe and beneficial. Antiviral drugs require a prescription from your provider. Having a fever caused by flu infection or other infections early in pregnancy may be linked to birth defects in a baby. In addition to taking antiviral drugs, pregnant women who get a fever should treat their fever with Tylenol (acetaminophen) and contact their provider immediately.  When to [place] If you are pregnant and have any of these signs, seek care immediately:  Difficulty breathing or shortness of breath  Pain or pressure in the chest or abdomen  Sudden dizziness  Confusion  Severe or persistent vomiting  High fever that is not responding to Tylenol (or store brand equivalent)  Decreased or no movement of your baby  [employer].[email or web address]

## 2020-02-12 NOTE — Progress Notes (Signed)
   LOW-RISK PREGNANCY VISIT Patient name: [name] MRN [id number]  Date of birth: 1991/04/18 Chief Complaint:   Routine Prenatal Visit  History of Present Illness:   [name] is a 29 y.o. [id number] female at [redacted]w[redacted]d with an Estimated Date of Delivery: 06/13/20 being seen today for ongoing management of a low-risk pregnancy.  Today she reports lips were numb this morning, otherwise doing well. Contractions: Not present. Vag. Bleeding: None.  Movement: Present. denies leaking of fluid. Review of Systems:   Pertinent items are noted in HPI Denies abnormal vaginal discharge w/ itching/odor/irritation, headaches, visual changes, shortness of breath, chest pain, abdominal pain, severe nausea/vomiting, or problems with urination or bowel movements unless otherwise stated above. Pertinent History Reviewed:  Reviewed past medical,surgical, social, obstetrical and family history.  Reviewed problem list, medications and allergies. Physical Assessment:   Vitals:   02/12/20 1346  BP: 109/63  Pulse: 86  Weight: 147 lb (66.7 kg)  Body mass index is 29.69 kg/m.        Physical Examination:   General appearance: Well appearing, and in no distress  Mental status: Alert, oriented to person, place, and time  Skin: Warm & dry  Cardiovascular: Normal heart rate noted  Respiratory: Normal respiratory effort, no distress  Abdomen: Soft, gravid, nontender  Pelvic: Cervical exam deferred         Extremities: Edema: None  Fetal Status: Fetal Heart Rate (bpm): 152 Fundal Height: 22 cm Movement: Present    No results found for this or any previous visit (from the past 24 hour(s)).  Assessment & Plan:  1) Low-risk pregnancy [id number] at [redacted]w[redacted]d with an Estimated Date of Delivery: 06/13/20   2) Would like TOLAC for this delivery, will discuss with MD at next visit   Meds: No orders of the defined types were placed in this encounter.  Labs/procedures today: none  Plan:  Continue routine obstetrical care  with PN2 next visit and MD discussion re TOLAC  Reviewed: Preterm labor symptoms and general obstetric precautions including but not limited to vaginal bleeding, contractions, leaking of fluid and fetal movement were reviewed in detail with the patient.  All questions were answered. Has home bp cuff. Check bp weekly, let us know if >140/90.   Follow-up: Return in about 4 weeks (around 03/11/2020) for PN2, LROB, in person with MD to discuss TOLAC.  No orders of the defined types were placed in this encounter.  Arabella Merles CNM 02/12/2020 2:00 PM

## 2020-03-11 ENCOUNTER — Other Ambulatory Visit: Payer: Self-pay

## 2020-03-12 ENCOUNTER — Encounter: Payer: Self-pay | Admitting: Obstetrics and Gynecology

## 2020-03-12 ENCOUNTER — Other Ambulatory Visit: Payer: Self-pay

## 2020-03-18 ENCOUNTER — Telehealth: Payer: Self-pay | Admitting: *Deleted

## 2020-03-18 NOTE — Telephone Encounter (Signed)
Cramping off and on since Saturday. Feeling better now. + baby movement, no spotting or leaking of fluid. Pt was advised to make sure she's drinking plenty of fluids. If cramping comes back, try laying down. Can take Tylenol if cramping comes back and she needs something for discomfort. Pt has appt next week. Advised to keep that appt and call back with any more questions. Pt voiced understanding. JSY

## 2020-03-18 NOTE — Telephone Encounter (Signed)
Pt left message that she is experiencing a lot of cramps today. Wants a nurse to call her back to let her know if this is ok.

## 2020-03-19 ENCOUNTER — Other Ambulatory Visit: Payer: Self-pay

## 2020-03-19 ENCOUNTER — Encounter: Payer: Self-pay | Admitting: Obstetrics & Gynecology

## 2020-03-20 NOTE — Progress Notes (Signed)
Pt not seen since 22 wk. Now 28 wk. Please call and make appt for PN2 LABS AND F/U APPT. CAN be separate appts.

## 2020-03-24 ENCOUNTER — Other Ambulatory Visit: Payer: Self-pay

## 2020-03-24 ENCOUNTER — Ambulatory Visit (INDEPENDENT_AMBULATORY_CARE_PROVIDER_SITE_OTHER): Payer: Self-pay | Admitting: Obstetrics & Gynecology

## 2020-03-24 VITALS — BP 109/64 | HR 87 | Wt 160.0 lb

## 2020-03-24 DIAGNOSIS — Z131 Encounter for screening for diabetes mellitus: Secondary | ICD-10-CM

## 2020-03-24 DIAGNOSIS — Z3A28 28 weeks gestation of pregnancy: Secondary | ICD-10-CM

## 2020-03-24 DIAGNOSIS — Z3483 Encounter for supervision of other normal pregnancy, third trimester: Secondary | ICD-10-CM

## 2020-03-24 DIAGNOSIS — Z98891 History of uterine scar from previous surgery: Secondary | ICD-10-CM

## 2020-03-24 DIAGNOSIS — O34219 Maternal care for unspecified type scar from previous cesarean delivery: Secondary | ICD-10-CM

## 2020-03-24 NOTE — Progress Notes (Signed)
   LOW-RISK PREGNANCY VISIT Patient name: [name] MRN [id number]  Date of birth: [birth year] Chief Complaint:   Routine Prenatal Visit (PN2 discuss tolac)  History of Present Illness:   [name] is a 29 y.o. [phone] female at [redacted]w[redacted]d with an Estimated Date of Delivery: 06/13/20 being seen today for ongoing management of a low-risk pregnancy.  Today she reports no complaints. Contractions: Not present. Vag. Bleeding: None.  Movement: Present. denies leaking of fluid. Review of Systems:   Pertinent items are noted in HPI Denies abnormal vaginal discharge w/ itching/odor/irritation, headaches, visual changes, shortness of breath, chest pain, abdominal pain, severe nausea/vomiting, or problems with urination or bowel movements unless otherwise stated above. Pertinent History Reviewed:  Reviewed past medical,surgical, social, obstetrical and family history.  Reviewed problem list, medications and allergies. Physical Assessment:   Vitals:   03/24/20 0941  BP: 109/64  Pulse: 87  Weight: 160 lb (72.6 kg)  Body mass index is 32.32 kg/m.        Physical Examination:   General appearance: Well appearing, and in no distress  Mental status: Alert, oriented to person, place, and time  Skin: Warm & dry  Cardiovascular: Normal heart rate noted  Respiratory: Normal respiratory effort, no distress  Abdomen: Soft, gravid, nontender  Pelvic: Cervical exam deferred         Extremities: Edema: None  Fetal Status:     Movement: Present    Chaperone: n/a    No results found for this or any previous visit (from the past 24 hour(s)).  Assessment & Plan:  1) Low-risk pregnancy [id number] at [redacted]w[redacted]d with an Estimated Date of Delivery: 06/13/20   2) Previous C section x 2, with intervening VBAC, wants repeat section with Left tubal ligation(s/p right salpingectomy for ectopic pregnancy)   Meds: No orders of the defined types were placed in this encounter.  Labs/procedures today: PN2  Plan:  Continue  routine obstetrical care  Next visit: prefers in person    Reviewed: Preterm labor symptoms and general obstetric precautions including but not limited to vaginal bleeding, contractions, leaking of fluid and fetal movement were reviewed in detail with the patient.  All questions were answered. Has home bp cuff. Rx faxed to . Check bp weekly, let us know if >140/90.   Follow-up: Return in about 3 weeks (around 04/14/2020) for LROB.  No orders of the defined types were placed in this encounter.  Lazaro Arms CNM, WHNP-BC 03/24/2020 10:40 AM

## 2020-03-25 ENCOUNTER — Other Ambulatory Visit: Payer: Self-pay | Admitting: Women's Health

## 2020-03-25 LAB — ANTIBODY SCREEN: Antibody Screen: NEGATIVE

## 2020-03-25 LAB — GLUCOSE TOLERANCE, 2 HOURS W/ 1HR
Glucose, 1 hour: 85 mg/dL (ref 65–179)
Glucose, 2 hour: 104 mg/dL (ref 65–152)
Glucose, Fasting: 84 mg/dL (ref 65–91)

## 2020-03-25 LAB — CBC
Hematocrit: 31 % — ABNORMAL LOW (ref 34.0–46.6)
Hemoglobin: 10.3 g/dL — ABNORMAL LOW (ref 11.1–15.9)
MCH: 29.5 pg (ref 26.6–33.0)
MCHC: 33.2 g/dL (ref 31.5–35.7)
MCV: 89 fL (ref 79–97)
Platelets: 250 10*3/uL (ref 150–450)
RBC: 3.49 x10E6/uL — ABNORMAL LOW (ref 3.77–5.28)
RDW: 12.2 % (ref 11.7–15.4)
WBC: 9.6 10*3/uL (ref 3.4–10.8)

## 2020-03-25 LAB — RPR: RPR Ser Ql: NONREACTIVE

## 2020-03-25 LAB — HIV ANTIBODY (ROUTINE TESTING W REFLEX): HIV Screen 4th Generation wRfx: NONREACTIVE

## 2020-03-25 MED ORDER — FERROUS SULFATE 325 (65 FE) MG PO TABS: 325.0000 mg | ORAL_TABLET | Freq: Two times a day (BID) | ORAL | 3 refills | Status: DC

## 2020-04-14 ENCOUNTER — Ambulatory Visit: Payer: Self-pay | Admitting: Women's Health

## 2020-04-14 ENCOUNTER — Other Ambulatory Visit: Payer: Self-pay

## 2020-04-14 ENCOUNTER — Encounter: Payer: Self-pay | Admitting: Women's Health

## 2020-04-14 VITALS — BP 110/62 | HR 80 | Wt 162.0 lb

## 2020-04-14 DIAGNOSIS — Z3483 Encounter for supervision of other normal pregnancy, third trimester: Secondary | ICD-10-CM

## 2020-04-14 DIAGNOSIS — Z3A31 31 weeks gestation of pregnancy: Secondary | ICD-10-CM

## 2020-04-14 DIAGNOSIS — Z98891 History of uterine scar from previous surgery: Secondary | ICD-10-CM

## 2020-04-14 DIAGNOSIS — Z1389 Encounter for screening for other disorder: Secondary | ICD-10-CM

## 2020-04-14 DIAGNOSIS — F329 Major depressive disorder, single episode, unspecified: Secondary | ICD-10-CM | POA: Insufficient documentation

## 2020-04-14 DIAGNOSIS — F32A Depression, unspecified: Secondary | ICD-10-CM | POA: Insufficient documentation

## 2020-04-14 DIAGNOSIS — Z331 Pregnant state, incidental: Secondary | ICD-10-CM

## 2020-04-14 LAB — POCT URINALYSIS DIPSTICK OB
Blood, UA: NEGATIVE
Glucose, UA: NEGATIVE
Ketones, UA: NEGATIVE
Leukocytes, UA: NEGATIVE
Nitrite, UA: NEGATIVE
POC,PROTEIN,UA: NEGATIVE

## 2020-04-14 NOTE — Progress Notes (Signed)
LOW-RISK PREGNANCY VISIT Patient name: [name] MRN [id number]  Date of birth: 1991-04-10 Chief Complaint:   Routine Prenatal Visit  History of Present Illness:   [name] is a 29 y.o. [id number] female at [redacted]w[redacted]d with an Estimated Date of Delivery: 06/13/20 being seen today for ongoing management of a low-risk pregnancy.  Depression screen [facility] 2/9 03/24/2020 12/03/2019  Decreased Interest 1 0  Down, Depressed, Hopeless 1 0  PHQ - 2 Score 2 0  Altered sleeping 1 -  Tired, decreased energy 1 -  Change in appetite 0 -  Feeling bad or failure about yourself  1 -  Trouble concentrating 0 -  Moving slowly or fidgety/restless 0 -  Suicidal thoughts 0 -  PHQ-9 Score 5 -    Today she reports wants RCS w/ Lt salpingectomy. Has already had Rt salpingectomy w/ ectopic pregnancy in 2019. H/O PPD, some depression now, denies SI, wants to go ahead and get on meds, declines counseling right now. Contractions: Not present. Vag. Bleeding: None.  Movement: Present. denies leaking of fluid. Review of Systems:   Pertinent items are noted in HPI Denies abnormal vaginal discharge w/ itching/odor/irritation, headaches, visual changes, shortness of breath, chest pain, abdominal pain, severe nausea/vomiting, or problems with urination or bowel movements unless otherwise stated above. Pertinent History Reviewed:  Reviewed past medical,surgical, social, obstetrical and family history.  Reviewed problem list, medications and allergies. Physical Assessment:   Vitals:   04/14/20 1337  BP: 110/62  Pulse: 80  Weight: 162 lb (73.5 kg)  Body mass index is 32.72 kg/m.        Physical Examination:   General appearance: Well appearing, and in no distress  Mental status: Alert, oriented to person, place, and time  Skin: Warm & dry  Cardiovascular: Normal heart rate noted  Respiratory: Normal respiratory effort, no distress  Abdomen: Soft, gravid, nontender  Pelvic: Cervical exam deferred          Extremities: Edema: None  Fetal Status: Fetal Heart Rate (bpm): 137 Fundal Height: 32 cm Movement: Present    Chaperone: n/a    Results for orders placed or performed in visit on 04/14/20 (from the past 24 hour(s))  POC Urinalysis Dipstick OB   Collection Time: 04/14/20  1:42 PM  Result Value Ref Range   Color, UA     Clarity, UA     Glucose, UA Negative Negative   Bilirubin, UA     Ketones, UA neg    Spec Grav, UA     Blood, UA neg    pH, UA     POC,PROTEIN,UA Negative Negative, Trace, Small (1+), Moderate (2+), Large (3+), 4+   Urobilinogen, UA     Nitrite, UA neg    Leukocytes, UA Negative Negative   Appearance     Odor      Assessment & Plan:  1) Low-risk pregnancy [id number] at [redacted]w[redacted]d with an Estimated Date of Delivery: 06/13/20   2) Prev c/s, wants RCS w/ Lt salpingectomy (has already had Rt)  3) Depression w/ h/o PPD> wants to get on meds now, rx zoloft 25mg , understands can take a few weeks of taking daily to help. Declines counseling.    Meds: No orders of the defined types were placed in this encounter.  Labs/procedures today: none, needs tdap- to check at [facility] or pharmacy to see if cheaper, if not can get here next visit  Plan:  Continue routine obstetrical care  Next visit: prefers in person  Reviewed: Preterm labor symptoms and general obstetric precautions including but not limited to vaginal bleeding, contractions, leaking of fluid and fetal movement were reviewed in detail with the patient.  All questions were answered.   Follow-up: Return in about 2 weeks (around 04/28/2020) for [place], in person w/ [facility].  Orders Placed This Encounter  Procedures  . POC Urinalysis Dipstick OB   Roma Schanz CNM, [facility] 04/14/2020 2:03 PM

## 2020-04-14 NOTE — Patient Instructions (Signed)
[name], I greatly value your feedback.  If you receive a survey following your visit with [place] today, we appreciate you taking the time to fill it out.  Thanks, Joellyn Haff, CNM, WHNP-BC   [facility] ([address] [place], [place] [address]) Entrance C, located off of [address] valet parking  Go to [email or web address] to register for FREE online childbirth classes   Call the office [phone]) or go to [facility] if:  You begin to have strong, frequent contractions  Your water breaks.  Sometimes it is a big gush of fluid, sometimes it is just a trickle that keeps getting your panties wet or running down your legs  You have vaginal bleeding.  It is normal to have a small amount of spotting if your cervix was checked.   You don't feel your baby moving like normal.  If you don't, get you something to eat and drink and lay down and focus on feeling your baby move.  You should feel at least 10 movements in 2 hours.  If you don't, you should call the office or go to [facility].    Tdap Vaccine  It is recommended that you get the Tdap vaccine during the third trimester of EACH pregnancy to help protect your baby from getting pertussis (whooping cough)  27-36 weeks is the BEST time to do this so that you can pass the protection on to your baby. During pregnancy is better than after pregnancy, but if you are unable to get it during pregnancy it will be offered at the hospital.   You can get this vaccine with [place], at the health department, your family doctor, or some local pharmacies  Everyone who will be around your baby should also be up-to-date on their vaccines before the baby comes. Adults (who are not pregnant) only need 1 dose of Tdap during adulthood.   [place] Pediatricians/Family Doctors:  [facility] [phone]            [facility] [phone]                 [facility]  [phone] (usually not accepting new patients unless you have family there already, you are always welcome to call and ask)       [facility] Department [phone]       [facility] Pediatricians/Family Doctors:   [facility]: [phone]  [facility]: [phone]  [facility]: [phone]  [facility] Doctors:   [facility]: [phone]   [facility]: [phone]  [facility] Doctors:  [facility]: [phone]   Home Blood Pressure Monitoring for Patients   Your provider has recommended that you check your blood pressure (BP) at least once a week at home. If you do not have a blood pressure cuff at home, one will be provided for you. Contact your provider if you have not received your monitor within 1 week.   Helpful Tips for Accurate Home Blood Pressure Checks  . Don't smoke, exercise, or drink caffeine 30 minutes before checking your BP . Use the restroom before checking your BP (a full bladder can raise your pressure) . Relax in a comfortable upright chair . Feet on the ground . Left arm resting comfortably on a flat surface at the level of your heart . Legs uncrossed . Back supported . Sit quietly and don't talk . Place the cuff on your bare  arm . Adjust snuggly, so that only two fingertips can fit between your skin and the top of the cuff . Check 2 readings separated by at least one minute . Keep a log of your BP readings . For a visual, please reference this diagram: http://ccnc.care/bpdiagram  Provider Name: [facility]     Phone: [phone]  Zone 1: ALL CLEAR  Continue to monitor your symptoms:  . BP reading is less than 140 (top number) or less than 90 (bottom number)  . No right upper stomach pain . No headaches or seeing spots . No feeling nauseated or throwing up . No swelling in face and hands  Zone 2: CAUTION Call your  doctor's office for any of the following:  . BP reading is greater than 140 (top number) or greater than 90 (bottom number)  . Stomach pain under your ribs in the middle or right side . Headaches or seeing spots . Feeling nauseated or throwing up . Swelling in face and hands  Zone 3: EMERGENCY  Seek immediate medical care if you have any of the following:  . BP reading is greater than160 (top number) or greater than 110 (bottom number) . Severe headaches not improving with Tylenol . Serious difficulty catching your breath . Any worsening symptoms from Zone 2   Third Trimester of Pregnancy The third trimester is from week 29 through week 42, months 7 through 9. The third trimester is a time when the fetus is growing rapidly. At the end of the ninth month, the fetus is about 20 inches in length and weighs 6-10 pounds.  BODY CHANGES Your body goes through many changes during pregnancy. The changes vary from woman to woman.   Your weight will continue to increase. You can expect to gain 25-35 pounds (11-16 kg) by the end of the pregnancy.  You may begin to get stretch marks on your hips, abdomen, and breasts.  You may urinate more often because the fetus is moving lower into your pelvis and pressing on your bladder.  You may develop or continue to have heartburn as a result of your pregnancy.  You may develop constipation because certain hormones are causing the muscles that push waste through your intestines to slow down.  You may develop hemorrhoids or swollen, bulging veins (varicose veins).  You may have pelvic pain because of the weight gain and pregnancy hormones relaxing your joints between the bones in your pelvis. Backaches may result from overexertion of the muscles supporting your posture.  You may have changes in your hair. These can include thickening of your hair, rapid growth, and changes in texture. Some women also have hair loss during or after pregnancy, or hair that  feels dry or thin. Your hair will most likely return to normal after your baby is born.  Your breasts will continue to grow and be tender. A yellow discharge may leak from your breasts called colostrum.  Your belly button may stick out.  You may feel short of breath because of your expanding uterus.  You may notice the fetus "dropping," or moving lower in your abdomen.  You may have a bloody mucus discharge. This usually occurs a few days to a week before labor begins.  Your cervix becomes thin and soft (effaced) near your due date. WHAT TO EXPECT AT YOUR PRENATAL EXAMS  You will have prenatal exams every 2 weeks until week 36. Then, you will have weekly prenatal exams. During a routine prenatal visit:  You  will be weighed to make sure you and the fetus are growing normally.  Your blood pressure is taken.  Your abdomen will be measured to track your baby's growth.  The fetal heartbeat will be listened to.  Any test results from the previous visit will be discussed.  You may have a cervical check near your due date to see if you have effaced. At around 36 weeks, your caregiver will check your cervix. At the same time, your caregiver will also perform a test on the secretions of the vaginal tissue. This test is to determine if a type of bacteria, Group B streptococcus, is present. Your caregiver will explain this further. Your caregiver may ask you:  What your birth plan is.  How you are feeling.  If you are feeling the baby move.  If you have had any abnormal symptoms, such as leaking fluid, bleeding, severe headaches, or abdominal cramping.  If you have any questions. Other tests or screenings that may be performed during your third trimester include:  Blood tests that check for low iron levels (anemia).  Fetal testing to check the health, activity level, and growth of the fetus. Testing is done if you have certain medical conditions or if there are problems during the  pregnancy. FALSE LABOR You may feel small, irregular contractions that eventually go away. These are called Braxton Hicks contractions, or false labor. Contractions may last for hours, days, or even weeks before true labor sets in. If contractions come at regular intervals, intensify, or become painful, it is best to be seen by your caregiver.  SIGNS OF LABOR   Menstrual-like cramps.  Contractions that are 5 minutes apart or less.  Contractions that start on the top of the uterus and spread down to the lower abdomen and back.  A sense of increased pelvic pressure or back pain.  A watery or bloody mucus discharge that comes from the vagina. If you have any of these signs before the 37th week of pregnancy, call your caregiver right away. You need to go to the hospital to get checked immediately. HOME CARE INSTRUCTIONS   Avoid all smoking, herbs, alcohol, and unprescribed drugs. These chemicals affect the formation and growth of the baby.  Follow your caregiver's instructions regarding medicine use. There are medicines that are either safe or unsafe to take during pregnancy.  Exercise only as directed by your caregiver. Experiencing uterine cramps is a good sign to stop exercising.  Continue to eat regular, healthy meals.  Wear a good support bra for breast tenderness.  Do not use hot tubs, steam rooms, or saunas.  Wear your seat belt at all times when driving.  Avoid raw meat, uncooked cheese, cat litter boxes, and soil used by cats. These carry germs that can cause birth defects in the baby.  Take your prenatal vitamins.  Try taking a stool softener (if your caregiver approves) if you develop constipation. Eat more high-fiber foods, such as fresh vegetables or fruit and whole grains. Drink plenty of fluids to keep your urine clear or pale yellow.  Take warm sitz baths to soothe any pain or discomfort caused by hemorrhoids. Use hemorrhoid cream if your caregiver approves.  If you  develop varicose veins, wear support hose. Elevate your feet for 15 minutes, 3-4 times a day. Limit salt in your diet.  Avoid heavy lifting, wear low heal shoes, and practice good posture.  Rest a lot with your legs elevated if you have leg cramps or low back  pain.  Visit your dentist if you have not gone during your pregnancy. Use a soft toothbrush to brush your teeth and be gentle when you floss.  A sexual relationship may be continued unless your caregiver directs you otherwise.  Do not travel far distances unless it is absolutely necessary and only with the approval of your caregiver.  Take prenatal classes to understand, practice, and ask questions about the labor and delivery.  Make a trial run to the hospital.  Pack your hospital bag.  Prepare the baby's nursery.  Continue to go to all your prenatal visits as directed by your caregiver. SEEK MEDICAL CARE IF:  You are unsure if you are in labor or if your water has broken.  You have dizziness.  You have mild pelvic cramps, pelvic pressure, or nagging pain in your abdominal area.  You have persistent nausea, vomiting, or diarrhea.  You have a bad smelling vaginal discharge.  You have pain with urination. SEEK IMMEDIATE MEDICAL CARE IF:   You have a fever.  You are leaking fluid from your vagina.  You have spotting or bleeding from your vagina.  You have severe abdominal cramping or pain.  You have rapid weight loss or gain.  You have shortness of breath with chest pain.  You notice sudden or extreme swelling of your face, hands, ankles, feet, or legs.  You have not felt your baby move in over an hour.  You have severe headaches that do not go away with medicine.  You have vision changes. Document Released: 12/06/2001 Document Revised: 12/17/2013 Document Reviewed: 02/12/2013 [facility] Patient Information 2015 [place], [place]. This information is not intended to replace advice given to you by your health  care provider. Make sure you discuss any questions you have with your health care provider.

## 2020-04-23 IMAGING — US US OB COMP LESS 14 WK
1 series · 13 of 28 positions shown · non-contrast
Comparison: None.

CLINICAL DATA: Right adnexal region pain for 1 day. Bleeding for 1
week. Patient reportedly is pregnant 12 weeks and 3 days based on
the last menstrual period.

EXAM:
OBSTETRIC <14 WK US AND TRANSVAGINAL OB US
TECHNIQUE: Both transabdominal and transvaginal ultrasound examinations were
performed for complete evaluation of the gestation as well as the
maternal uterus, adnexal regions, and pelvic cul-de-sac.
Transvaginal technique was performed to assess early pregnancy.

[Series 1: us ob comp less 14 wk · 0.19mm/px · 78 acquisitions, 13 frames shown]
[im 3/78]
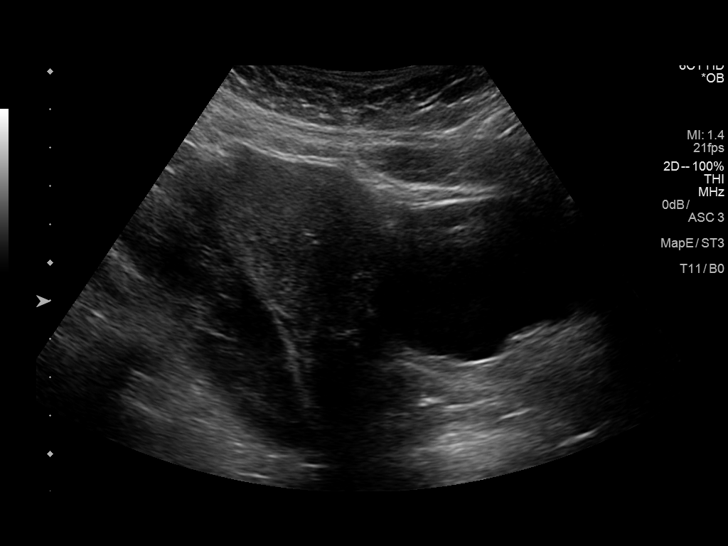
[im 9/78]
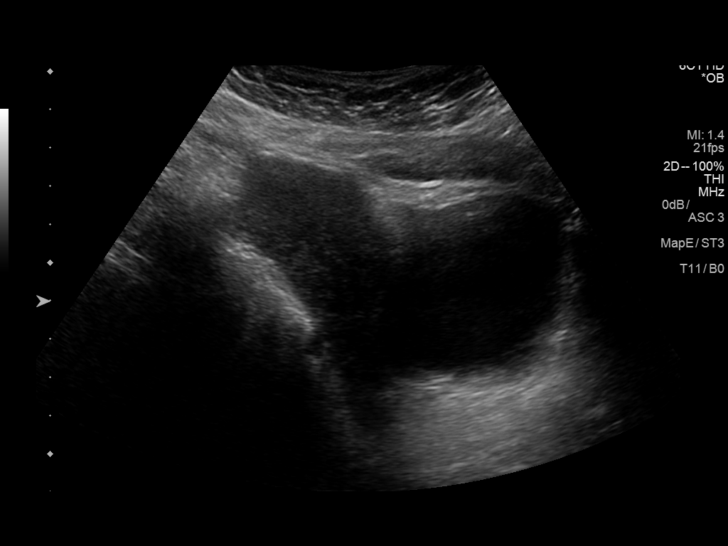
[im 15/78]
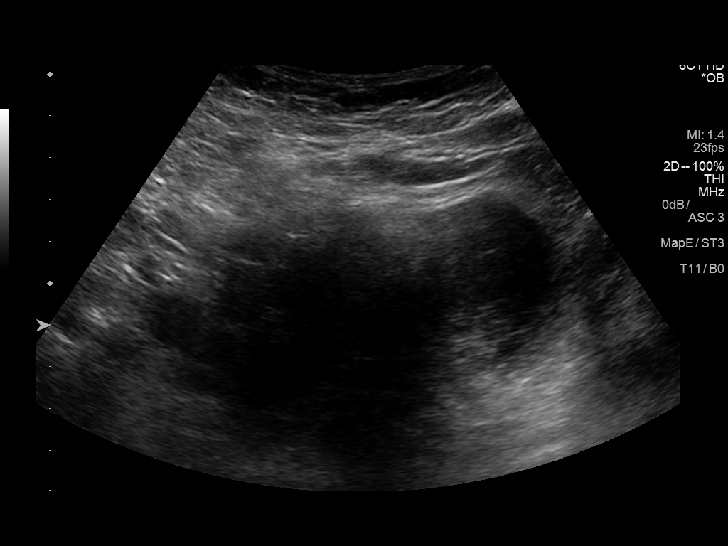
[im 20/78]
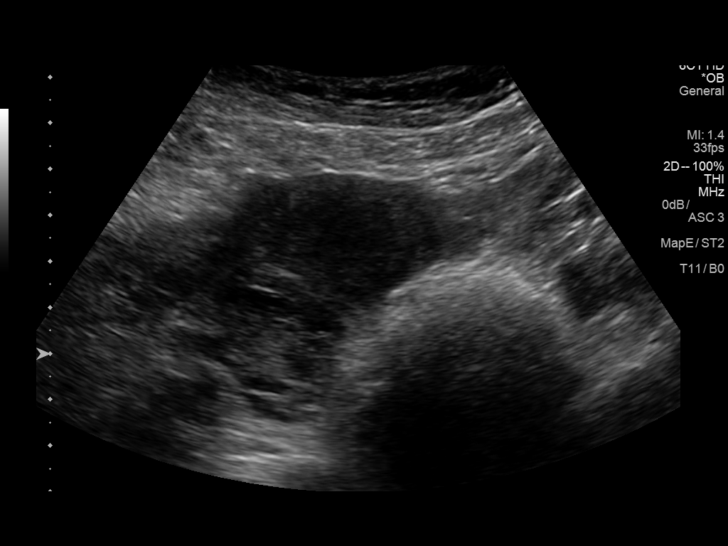
[im 26/78]
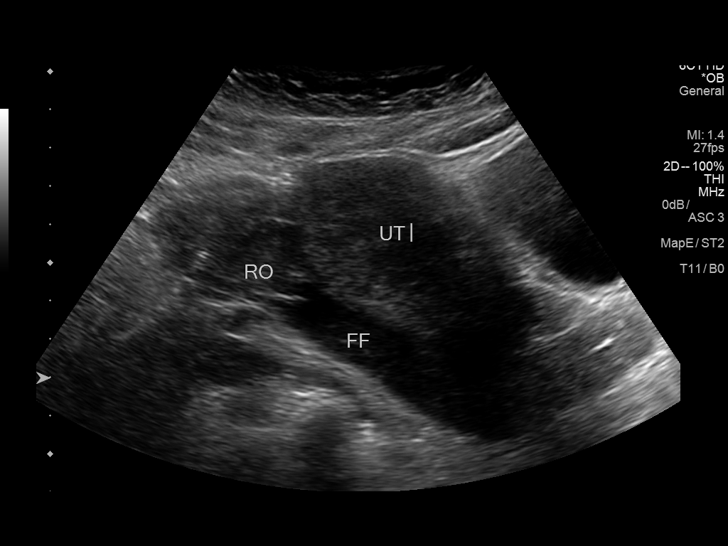
[im 32/78]
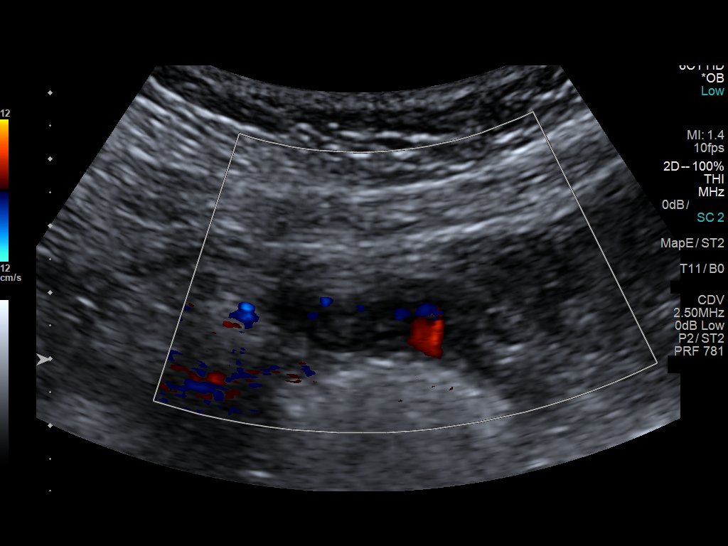
[im 40/78]
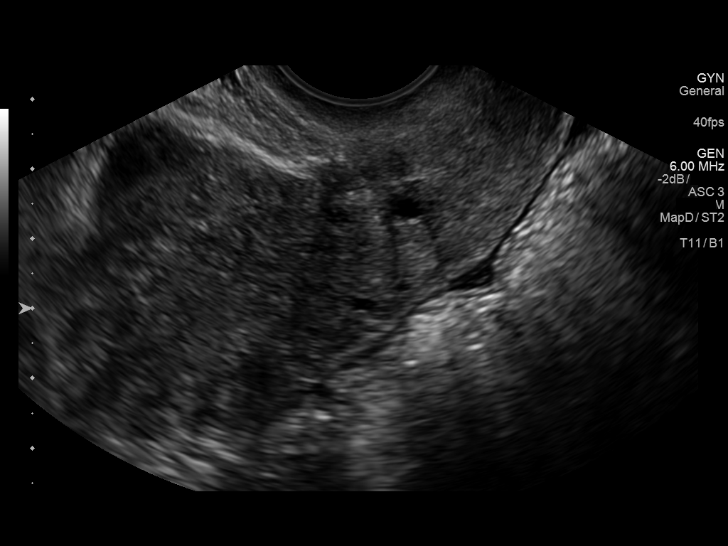
[im 46/78]
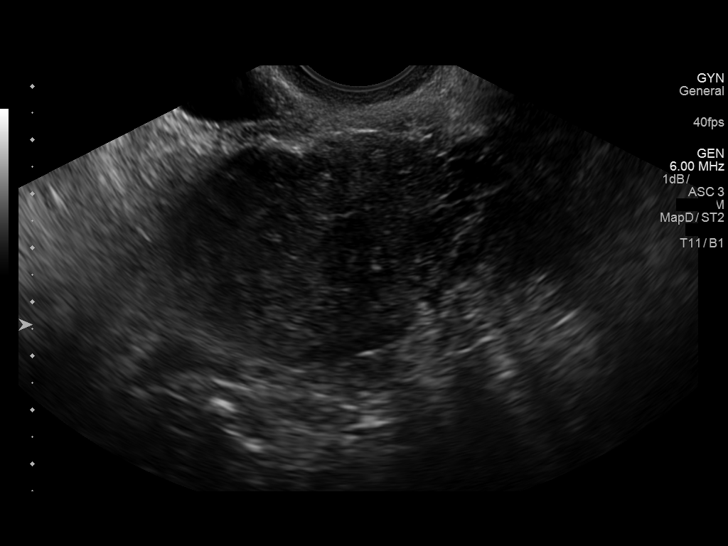
[im 52/78]
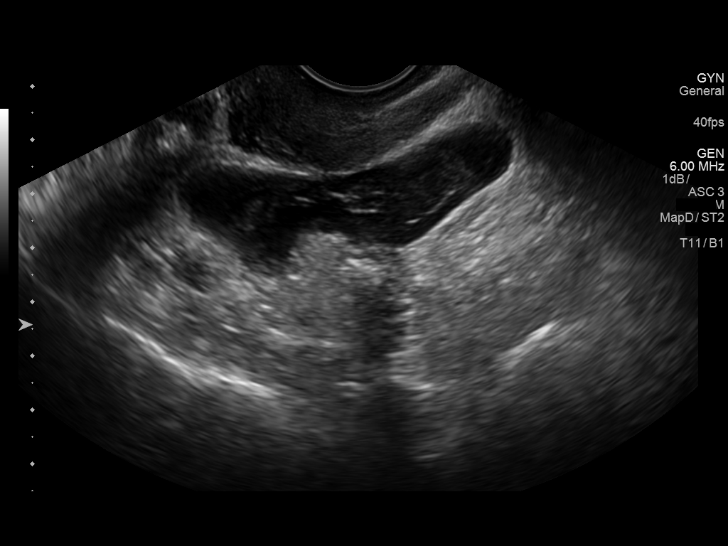
[im 58/78]
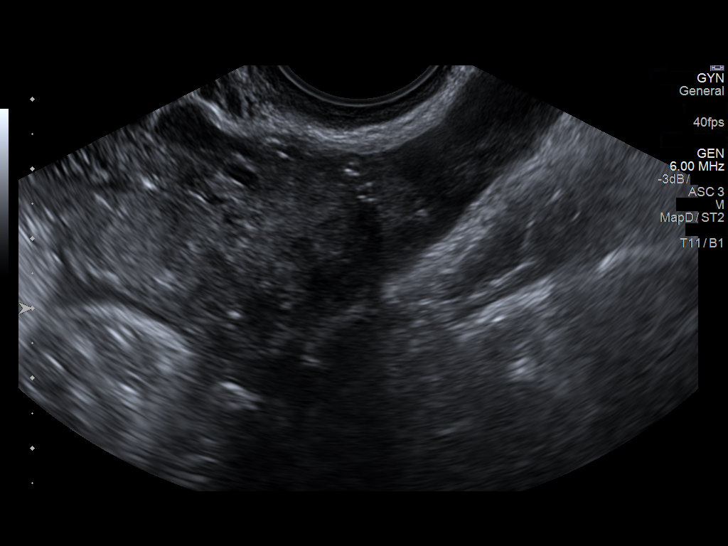
[im 63/78]
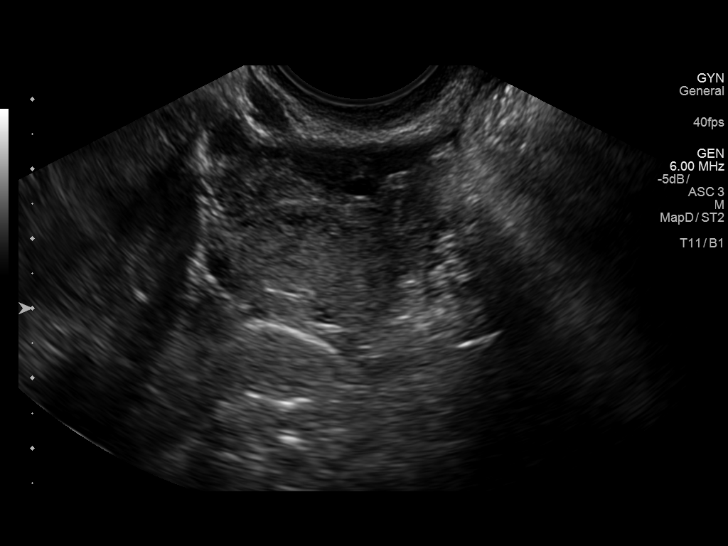
[im 69/78]
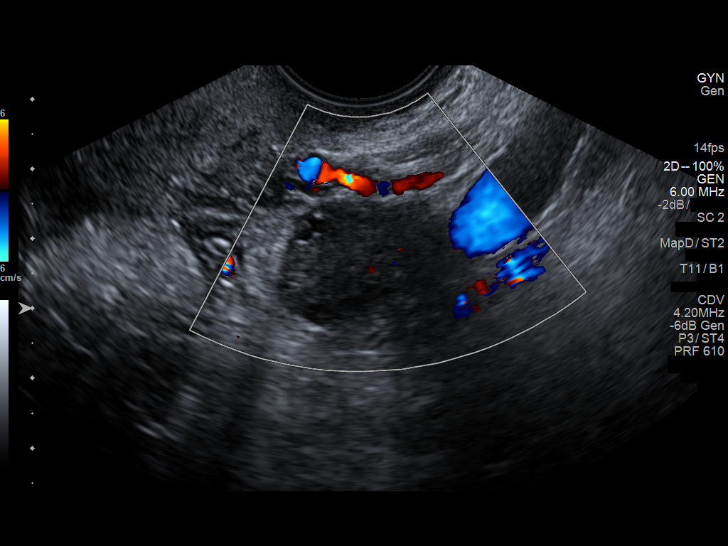
[im 75/78]
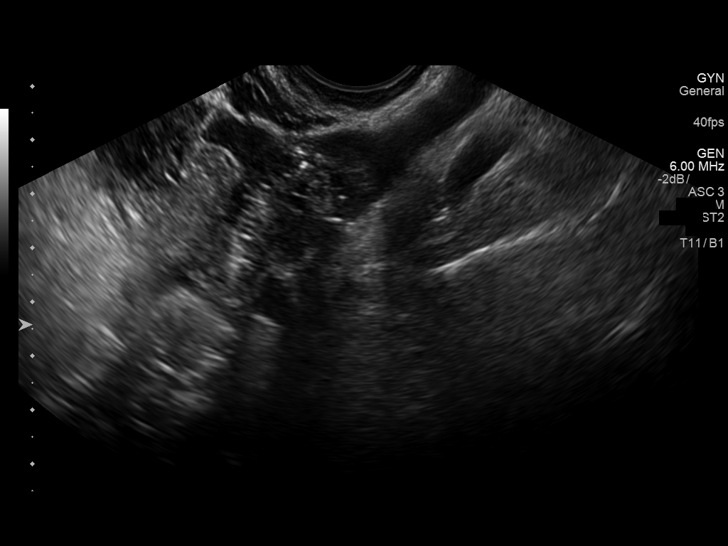

[13 of 28 positions shown; findings below may reference images not displayed]

FINDINGS: Intrauterine gestational sac: None

Yolk sac:  Visualized.

Embryo:  Visualized.

Maternal uterus/adnexae: Uterus is somewhat heterogeneous
appearance. No uterine mass. No endometrial fluid. Cervix is closed.

Right ovary is not well-defined. There is heterogeneous echogenic
material contiguous with the right ovary. Ovary measures
approximately 3.1 x 1.9 x 2.7 cm. There is adjacent pelvic free
fluid, small amount, but with some internal echoes.

Left ovary is normal in size and echogenicity. No left adnexal
masses.
IMPRESSION: 1. No intrauterine pregnancy.
2. There is heterogeneous material adjacent to the right ovary with
an adjacent small amount of pelvic free fluid. Given the lack of an
intrauterine pregnancy and the positive pregnancy test, an ectopic
pregnancy is possible. Alternatively, patient may have had a
previous miscarriage with the current findings due to a ruptured
ovarian cyst. Follow-up with serial beta HCG levels and repeat
ultrasound in 7-10 days is recommended.

## 2020-04-28 ENCOUNTER — Other Ambulatory Visit: Payer: Self-pay

## 2020-04-28 ENCOUNTER — Encounter: Payer: Self-pay | Admitting: Obstetrics & Gynecology

## 2020-04-28 ENCOUNTER — Ambulatory Visit: Payer: Self-pay | Admitting: Obstetrics & Gynecology

## 2020-04-28 VITALS — BP 98/66 | HR 92 | Wt 165.0 lb

## 2020-04-28 DIAGNOSIS — Z3493 Encounter for supervision of normal pregnancy, unspecified, third trimester: Secondary | ICD-10-CM

## 2020-04-28 DIAGNOSIS — Z331 Pregnant state, incidental: Secondary | ICD-10-CM

## 2020-04-28 DIAGNOSIS — Z3A33 33 weeks gestation of pregnancy: Secondary | ICD-10-CM

## 2020-04-28 DIAGNOSIS — Z1389 Encounter for screening for other disorder: Secondary | ICD-10-CM

## 2020-04-28 LAB — POCT URINALYSIS DIPSTICK OB
Blood, UA: NEGATIVE
Glucose, UA: NEGATIVE
Ketones, UA: NEGATIVE
Leukocytes, UA: NEGATIVE
Nitrite, UA: NEGATIVE

## 2020-04-28 NOTE — Progress Notes (Signed)
LOW-RISK PREGNANCY VISIT Patient name: [name] MRN [id number]  Date of birth: 1991-07-19 Chief Complaint:   Routine Prenatal Visit  History of Present Illness:   [name] is a 29 y.o. [id number] female at [redacted]w[redacted]d with an Estimated Date of Delivery: 06/13/20 being seen today for ongoing management of a low-risk pregnancy.  Depression screen [facility] 2/9 03/24/2020 12/03/2019  Decreased Interest 1 0  Down, Depressed, Hopeless 1 0  PHQ - 2 Score 2 0  Altered sleeping 1 -  Tired, decreased energy 1 -  Change in appetite 0 -  Feeling bad or failure about yourself  1 -  Trouble concentrating 0 -  Moving slowly or fidgety/restless 0 -  Suicidal thoughts 0 -  PHQ-9 Score 5 -    Today she reports no complaints. Contractions: Irregular. Vag. Bleeding: None.  Movement: Present. denies leaking of fluid. Review of Systems:   Pertinent items are noted in HPI Denies abnormal vaginal discharge w/ itching/odor/irritation, headaches, visual changes, shortness of breath, chest pain, abdominal pain, severe nausea/vomiting, or problems with urination or bowel movements unless otherwise stated above. Pertinent History Reviewed:  Reviewed past medical,surgical, social, obstetrical and family history.  Reviewed problem list, medications and allergies. Physical Assessment:   Vitals:   04/28/20 1015  BP: 98/66  Pulse: 92  Weight: 165 lb (74.8 kg)  Body mass index is 33.33 kg/m.        Physical Examination:   General appearance: Well appearing, and in no distress  Mental status: Alert, oriented to person, place, and time  Skin: Warm & dry  Cardiovascular: Normal heart rate noted  Respiratory: Normal respiratory effort, no distress  Abdomen: Soft, gravid, nontender  Pelvic: Cervical exam deferred         Extremities: Edema: Trace  Fetal Status: Fetal Heart Rate (bpm): 134 Fundal Height: 33 cm Movement: Present    Chaperone: n/a    Results for orders placed or performed in visit on 04/28/20  (from the past 24 hour(s))  POC Urinalysis Dipstick OB   Collection Time: 04/28/20 10:16 AM  Result Value Ref Range   Color, UA     Clarity, UA     Glucose, UA Negative Negative   Bilirubin, UA     Ketones, UA n    Spec Grav, UA     Blood, UA n    pH, UA     POC,PROTEIN,UA Trace Negative, Trace, Small (1+), Moderate (2+), Large (3+), 4+   Urobilinogen, UA     Nitrite, UA n    Leukocytes, UA Negative Negative   Appearance     Odor      Assessment & Plan:  1) Low-risk pregnancy [id number] at [redacted]w[redacted]d with an Estimated Date of Delivery: 06/13/20   2) Previous C section, desires repeat + left salpingectomy(s/p right salpingectomy from ectopic),    Meds: No orders of the defined types were placed in this encounter.  Labs/procedures today:   Plan:  Continue routine obstetrical care  Next visit: prefers in person    Reviewed: Preterm labor symptoms and general obstetric precautions including but not limited to vaginal bleeding, contractions, leaking of fluid and fetal movement were reviewed in detail with the patient.  All questions were answered. No home bp cuff. Rx faxed to . Check bp weekly, let us know if >140/90.   Follow-up: Return in about 2 weeks (around 05/12/2020) for [place].  Orders Placed This Encounter  Procedures  . Tdap vaccine greater than or equal to 7yo IM  .  POC Urinalysis Dipstick OB   [name]  04/28/2020 10:29 AM

## 2020-05-12 ENCOUNTER — Encounter: Payer: Self-pay | Admitting: Obstetrics & Gynecology

## 2020-05-12 ENCOUNTER — Other Ambulatory Visit: Payer: Self-pay

## 2020-05-12 ENCOUNTER — Ambulatory Visit (INDEPENDENT_AMBULATORY_CARE_PROVIDER_SITE_OTHER): Payer: Self-pay | Admitting: Obstetrics & Gynecology

## 2020-05-12 VITALS — BP 102/68 | HR 96 | Wt 173.0 lb

## 2020-05-12 DIAGNOSIS — Z98891 History of uterine scar from previous surgery: Secondary | ICD-10-CM

## 2020-05-12 DIAGNOSIS — O3429 Maternal care due to uterine scar from other previous surgery: Secondary | ICD-10-CM

## 2020-05-12 DIAGNOSIS — Z1389 Encounter for screening for other disorder: Secondary | ICD-10-CM

## 2020-05-12 DIAGNOSIS — Z331 Pregnant state, incidental: Secondary | ICD-10-CM

## 2020-05-12 DIAGNOSIS — Z3483 Encounter for supervision of other normal pregnancy, third trimester: Secondary | ICD-10-CM

## 2020-05-12 DIAGNOSIS — O09293 Supervision of pregnancy with other poor reproductive or obstetric history, third trimester: Secondary | ICD-10-CM

## 2020-05-12 DIAGNOSIS — Z3A35 35 weeks gestation of pregnancy: Secondary | ICD-10-CM

## 2020-05-12 LAB — POCT URINALYSIS DIPSTICK OB
Blood, UA: NEGATIVE
Glucose, UA: NEGATIVE
Ketones, UA: NEGATIVE
Leukocytes, UA: NEGATIVE
Nitrite, UA: NEGATIVE
POC,PROTEIN,UA: NEGATIVE

## 2020-05-12 NOTE — Progress Notes (Signed)
LOW-RISK PREGNANCY VISIT Patient name: [name] MRN [id number]  Date of birth: 1991-12-18 Chief Complaint:   Routine Prenatal Visit  History of Present Illness:   [name] is a 29 y.o. [id number] female at [redacted]w[redacted]d with an Estimated Date of Delivery: 06/13/20 being seen today for ongoing management of a low-risk pregnancy.  Depression screen [facility] 2/9 03/24/2020 12/03/2019  Decreased Interest 1 0  Down, Depressed, Hopeless 1 0  PHQ - 2 Score 2 0  Altered sleeping 1 -  Tired, decreased energy 1 -  Change in appetite 0 -  Feeling bad or failure about yourself  1 -  Trouble concentrating 0 -  Moving slowly or fidgety/restless 0 -  Suicidal thoughts 0 -  PHQ-9 Score 5 -    Today she reports no complaints. Contractions: Irregular.  .  Movement: Present. denies leaking of fluid. Review of Systems:   Pertinent items are noted in HPI Denies abnormal vaginal discharge w/ itching/odor/irritation, headaches, visual changes, shortness of breath, chest pain, abdominal pain, severe nausea/vomiting, or problems with urination or bowel movements unless otherwise stated above. Pertinent History Reviewed:  Reviewed past medical,surgical, social, obstetrical and family history.  Reviewed problem list, medications and allergies. Physical Assessment:   Vitals:   05/12/20 0951  BP: 102/68  Pulse: 96  Weight: 173 lb (78.5 kg)  Body mass index is 34.94 kg/m.        Physical Examination:   General appearance: Well appearing, and in no distress  Mental status: Alert, oriented to person, place, and time  Skin: Warm & dry  Cardiovascular: Normal heart rate noted  Respiratory: Normal respiratory effort, no distress  Abdomen: Soft, gravid, nontender  Pelvic: Cervical exam deferred         Extremities: Edema: Trace  Fetal Status: Fetal Heart Rate (bpm): 136 Fundal Height: 35 cm Movement: Present    Chaperone: n/a    Results for orders placed or performed in visit on 05/12/20 (from the past 24  hour(s))  POC Urinalysis Dipstick OB   Collection Time: 05/12/20  9:57 AM  Result Value Ref Range   Color, UA     Clarity, UA     Glucose, UA Negative Negative   Bilirubin, UA     Ketones, UA neg    Spec Grav, UA     Blood, UA neg    pH, UA     POC,PROTEIN,UA Negative Negative, Trace, Small (1+), Moderate (2+), Large (3+), 4+   Urobilinogen, UA     Nitrite, UA neg    Leukocytes, UA Negative Negative   Appearance     Odor      Assessment & Plan:  1) Low-risk pregnancy [id number] at [redacted]w[redacted]d with an Estimated Date of Delivery: 06/13/20   2) Previous C section for repeat + left salpingectomy(hx of right salpingectomy with ectopic),    Meds: No orders of the defined types were placed in this encounter.  Labs/procedures today:   Plan:  Continue routine obstetrical care  Next visit: prefers in person    Reviewed: Term labor symptoms and general obstetric precautions including but not limited to vaginal bleeding, contractions, leaking of fluid and fetal movement were reviewed in detail with the patient.  All questions were answered. has home bp cuff. Rx faxed to . Check bp weekly, let us know if >140/90.   Follow-up: Return in about 2 weeks (around 05/26/2020) for LROB.  Orders Placed This Encounter  Procedures  . POC Urinalysis Dipstick OB   Amaryllis Dyke  Matthias Bogus  05/12/2020 10:24 AM

## 2020-05-28 ENCOUNTER — Inpatient Hospital Stay (HOSPITAL_COMMUNITY)
Admission: AD | Admit: 2020-05-28 | Discharge: 2020-05-28 | Disposition: A | Discharge status: Home / Self Care | Payer: Medicaid Other | Attending: Obstetrics & Gynecology | Admitting: Obstetrics & Gynecology

## 2020-05-28 ENCOUNTER — Encounter (HOSPITAL_COMMUNITY): Payer: Self-pay | Admitting: Obstetrics & Gynecology

## 2020-05-28 ENCOUNTER — Ambulatory Visit (INDEPENDENT_AMBULATORY_CARE_PROVIDER_SITE_OTHER): Payer: Medicaid Other | Admitting: Advanced Practice Midwife

## 2020-05-28 ENCOUNTER — Other Ambulatory Visit (HOSPITAL_COMMUNITY)
Admission: RE | Admit: 2020-05-28 | Discharge: 2020-05-28 | Disposition: A | Discharge status: Home / Self Care | Payer: Medicaid Other | Source: Ambulatory Visit | Attending: Advanced Practice Midwife | Admitting: Advanced Practice Midwife

## 2020-05-28 VITALS — BP 111/71 | HR 95 | Wt 172.0 lb

## 2020-05-28 DIAGNOSIS — Z113 Encounter for screening for infections with a predominantly sexual mode of transmission: Secondary | ICD-10-CM | POA: Diagnosis not present

## 2020-05-28 DIAGNOSIS — Z331 Pregnant state, incidental: Secondary | ICD-10-CM

## 2020-05-28 DIAGNOSIS — Z1389 Encounter for screening for other disorder: Secondary | ICD-10-CM

## 2020-05-28 DIAGNOSIS — Z3A37 37 weeks gestation of pregnancy: Secondary | ICD-10-CM | POA: Diagnosis not present

## 2020-05-28 DIAGNOSIS — O479 False labor, unspecified: Secondary | ICD-10-CM | POA: Diagnosis not present

## 2020-05-28 DIAGNOSIS — Z3483 Encounter for supervision of other normal pregnancy, third trimester: Secondary | ICD-10-CM

## 2020-05-28 DIAGNOSIS — Z3A00 Weeks of gestation of pregnancy not specified: Secondary | ICD-10-CM | POA: Diagnosis not present

## 2020-05-28 LAB — POCT URINALYSIS DIPSTICK OB
Blood, UA: NEGATIVE
Glucose, UA: NEGATIVE
Ketones, UA: NEGATIVE
Leukocytes, UA: NEGATIVE
Nitrite, UA: NEGATIVE
POC,PROTEIN,UA: NEGATIVE

## 2020-05-28 NOTE — Patient Instructions (Addendum)

## 2020-05-28 NOTE — Progress Notes (Signed)
   LOW-RISK PREGNANCY VISIT Patient name: [name] MRN [id number]  Date of birth: [birth year] Chief Complaint:   Routine Prenatal Visit  History of Present Illness:   [name] is a 29 y.o. T6L4650 female at [redacted]w[redacted]d with an Estimated Date of Delivery: 06/13/20 being seen today for ongoing management of a low-risk pregnancy.  Today she reports occasional contractions. Contractions: Irregular. Vag. Bleeding: None.  Movement: Present. denies leaking of fluid. Review of Systems:   Pertinent items are noted in HPI Denies abnormal vaginal discharge w/ itching/odor/irritation, headaches, visual changes, shortness of breath, chest pain, abdominal pain, severe nausea/vomiting, or problems with urination or bowel movements unless otherwise stated above. Pertinent History Reviewed:  Reviewed past medical,surgical, social, obstetrical and family history.  Reviewed problem list, medications and allergies. Physical Assessment:   Vitals:   05/28/20 0923  BP: 111/71  Pulse: 95  Weight: 172 lb (78 kg)  Body mass index is 34.74 kg/m.        Physical Examination:   General appearance: Well appearing, and in no distress  Mental status: Alert, oriented to person, place, and time  Skin: Warm & dry  Cardiovascular: Normal heart rate noted  Respiratory: Normal respiratory effort, no distress  Abdomen: Soft, gravid, nontender  Pelvic: Cervical exam performed  Dilation: 1 Effacement (%): 50 Station: -3  Extremities: Edema: Trace  Fetal Status: Fetal Heart Rate (bpm): 136 Fundal Height: 37 cm Movement: Present Presentation: Vertex  Chaperone: Cathie Beams CNM     Results for orders placed or performed in visit on 05/28/20 (from the past 24 hour(s))  POC Urinalysis Dipstick OB   Collection Time: 05/28/20  9:22 AM  Result Value Ref Range   Color, UA     Clarity, UA     Glucose, UA Negative Negative   Bilirubin, UA     Ketones, UA N    Spec Grav, UA     Blood, UA N    pH, UA     POC,PROTEIN,UA Negative Negative, Trace, Small (1+), Moderate (2+), Large (3+), 4+   Urobilinogen, UA     Nitrite, UA N    Leukocytes, UA Negative Negative   Appearance     Odor      Assessment & Plan:  1) Low-risk pregnancy [id number] at [redacted]w[redacted]d with an Estimated Date of Delivery: 06/13/20   2) Previous C section, desires repeat + left salpingectomy(s/p right salpingectomy from ectopic   Meds: No orders of the defined types were placed in this encounter.  Labs/procedures today: GBS & GC/C Swab performed  Plan:  Continue routine obstetrical care. Next visit: prefers will be in person for Dr. Despina Hidden    Reviewed: Term labor symptoms and general obstetric precautions including but not limited to vaginal bleeding, contractions, leaking of fluid and fetal movement were reviewed in detail with the patient.  All questions were answered. Does not have home bp cuff.  Check bp weekly, let us know if >140/90.   Follow-up: Return in about 1 week (around 06/04/2020) for LROB w/LHE if possible.  Orders Placed This Encounter  Procedures  . Strep Gp B NAA  . POC Urinalysis Dipstick OB   Fuller Mandril S-NP 05/28/2020 9:45 AM

## 2020-05-28 NOTE — Discharge Instructions (Signed)
Braxton Hicks Contractions °Contractions of the uterus can occur throughout pregnancy, but they are not always a sign that you are in labor. You may have practice contractions called Braxton Hicks contractions. These false labor contractions are sometimes confused with true labor. °What are Braxton Hicks contractions? °Braxton Hicks contractions are tightening movements that occur in the muscles of the uterus before labor. Unlike true labor contractions, these contractions do not result in opening (dilation) and thinning of the cervix. Toward the end of pregnancy (32-34 weeks), Braxton Hicks contractions can happen more often and may become stronger. These contractions are sometimes difficult to tell apart from true labor because they can be very uncomfortable. You should not feel embarrassed if you go to the hospital with false labor. °Sometimes, the only way to tell if you are in true labor is for your health care provider to look for changes in the cervix. The health care provider will do a physical exam and may monitor your contractions. If you are not in true labor, the exam should show that your cervix is not dilating and your water has not broken. °If there are no other health problems associated with your pregnancy, it is completely safe for you to be sent home with false labor. You may continue to have Braxton Hicks contractions until you go into true labor. °How to tell the difference between true labor and false labor °True labor °· Contractions last 30-70 seconds. °· Contractions become very regular. °· Discomfort is usually felt in the top of the uterus, and it spreads to the lower abdomen and low back. °· Contractions do not go away with walking. °· Contractions usually become more intense and increase in frequency. °· The cervix dilates and gets thinner. °False labor °· Contractions are usually shorter and not as strong as true labor contractions. °· Contractions are usually irregular. °· Contractions  are often felt in the front of the lower abdomen and in the groin. °· Contractions may go away when you walk around or change positions while lying down. °· Contractions get weaker and are shorter-lasting as time goes on. °· The cervix usually does not dilate or become thin. °Follow these instructions at home: ° °· Take over-the-counter and prescription medicines only as told by your health care provider. °· Keep up with your usual exercises and follow other instructions from your health care provider. °· Eat and drink lightly if you think you are going into labor. °· If Braxton Hicks contractions are making you uncomfortable: °? Change your position from lying down or resting to walking, or change from walking to resting. °? Sit and rest in a tub of warm water. °? Drink enough fluid to keep your urine pale yellow. Dehydration may cause these contractions. °? Do slow and deep breathing several times an hour. °· Keep all follow-up prenatal visits as told by your health care provider. This is important. °Contact a health care provider if: °· You have a fever. °· You have continuous pain in your abdomen. °Get help right away if: °· Your contractions become stronger, more regular, and closer together. °· You have fluid leaking or gushing from your vagina. °· You pass blood-tinged mucus (bloody show). °· You have bleeding from your vagina. °· You have low back pain that you never had before. °· You feel your baby’s head pushing down and causing pelvic pressure. °· Your baby is not moving inside you as much as it used to. °Summary °· Contractions that occur before labor are   called Braxton Hicks contractions, false labor, or practice contractions. °· Braxton Hicks contractions are usually shorter, weaker, farther apart, and less regular than true labor contractions. True labor contractions usually become progressively stronger and regular, and they become more frequent. °· Manage discomfort from Braxton Hicks contractions  by changing position, resting in a warm bath, drinking plenty of water, or practicing deep breathing. °This information is not intended to replace advice given to you by your health care provider. Make sure you discuss any questions you have with your health care provider. °Document Revised: 11/24/2017 Document Reviewed: 04/27/2017 °Elsevier Patient Education © 2020 [employer] ° °

## 2020-05-28 NOTE — MAU Note (Addendum)
Ctxs since 1900. Some lower back pain with ctxs and nausea. Was 1cm and 50% today at [facility]. Denies VB or LOF. For repeat C/S 6/12

## 2020-05-29 LAB — CERVICOVAGINAL ANCILLARY ONLY
Chlamydia: NEGATIVE
Comment: NEGATIVE
Comment: NORMAL
Neisseria Gonorrhea: NEGATIVE

## 2020-05-29 NOTE — Patient Instructions (Signed)
[name]  05/29/2020   Your procedure is scheduled on:  06/06/2020  Arrive at 0530 at Entrance C on [facility] at [facility]  and [employer]. You are invited to use the FREE valet parking or use the Visitor's parking deck.  Pick up the phone at the desk and dial [phone].  Call this number if you have problems the morning of surgery: [phone]  Remember:   Do not eat food:(After Midnight) Desps de medianoche.  Do not drink clear liquids: (After Midnight) Desps de medianoche.  Take these medicines the morning of surgery with A SIP OF WATER:  none   Do not wear jewelry, make-up or nail polish.  Do not wear lotions, powders, or perfumes. Do not wear deodorant.  Do not shave 48 hours prior to surgery.  Do not bring valuables to the hospital.  [facility] is not   responsible for any belongings or valuables brought to the hospital.  Contacts, dentures or bridgework may not be worn into surgery.  Leave suitcase in the car. After surgery it may be brought to your room.  For patients admitted to the hospital, checkout time is 11:00 AM the day of              discharge.      Please read over the following fact sheets that you were given:     Preparing for Surgery

## 2020-05-30 LAB — STREP GP B NAA: Strep Gp B NAA: NEGATIVE

## 2020-06-01 ENCOUNTER — Telehealth (HOSPITAL_COMMUNITY): Payer: Self-pay | Admitting: *Deleted

## 2020-06-01 NOTE — Telephone Encounter (Signed)
Preadmission screen  

## 2020-06-02 ENCOUNTER — Telehealth (HOSPITAL_COMMUNITY): Payer: Self-pay | Admitting: *Deleted

## 2020-06-02 NOTE — Telephone Encounter (Signed)
Preadmission screen  

## 2020-06-03 ENCOUNTER — Other Ambulatory Visit: Payer: Self-pay

## 2020-06-03 ENCOUNTER — Inpatient Hospital Stay (HOSPITAL_COMMUNITY)
Admission: AD | Admit: 2020-06-03 | Discharge: 2020-06-04 | Disposition: A | Discharge status: Home / Self Care | Payer: Medicaid Other | Attending: Obstetrics and Gynecology | Admitting: Obstetrics and Gynecology

## 2020-06-03 ENCOUNTER — Encounter (HOSPITAL_COMMUNITY): Payer: Self-pay

## 2020-06-03 ENCOUNTER — Encounter (HOSPITAL_COMMUNITY): Payer: Self-pay | Admitting: Obstetrics and Gynecology

## 2020-06-03 DIAGNOSIS — Z87891 Personal history of nicotine dependence: Secondary | ICD-10-CM | POA: Diagnosis not present

## 2020-06-03 DIAGNOSIS — Z98891 History of uterine scar from previous surgery: Secondary | ICD-10-CM | POA: Insufficient documentation

## 2020-06-03 DIAGNOSIS — Z20822 Contact with and (suspected) exposure to covid-19: Secondary | ICD-10-CM | POA: Diagnosis not present

## 2020-06-03 DIAGNOSIS — Z3A38 38 weeks gestation of pregnancy: Secondary | ICD-10-CM | POA: Diagnosis not present

## 2020-06-03 DIAGNOSIS — Z8759 Personal history of other complications of pregnancy, childbirth and the puerperium: Secondary | ICD-10-CM | POA: Diagnosis not present

## 2020-06-03 DIAGNOSIS — Z90721 Acquired absence of ovaries, unilateral: Secondary | ICD-10-CM | POA: Insufficient documentation

## 2020-06-03 DIAGNOSIS — O479 False labor, unspecified: Secondary | ICD-10-CM

## 2020-06-03 DIAGNOSIS — Z79899 Other long term (current) drug therapy: Secondary | ICD-10-CM | POA: Diagnosis not present

## 2020-06-03 DIAGNOSIS — O26893 Other specified pregnancy related conditions, third trimester: Secondary | ICD-10-CM

## 2020-06-03 NOTE — MAU Provider Note (Signed)
Chief Complaint:  Contractions and Rupture of Membranes   First Provider Initiated Contact with Patient 06/03/20 2355     HPI  HPI: [name] is a 29 y.o. [id number] at 54w4dwho presents to maternity admissions reporting increased vaginal discharge and contractions.  They were more painful earlier and every 4 min on the trip here, now less so. . She reports good fetal movement, denies vaginal bleeding, vaginal itching/burning, urinary symptoms, h/a, dizziness, n/v, diarrhea, constipation or fever/chills.    RN Note: Pt reports to MAU for contractions and LOF. She states the fluid leaking started a few days ago along with more discharge than usual. Denies VB. endorces movement.   Past Medical History: Past Medical History:  Diagnosis Date  . Medical history non-contributory     Past obstetric history: OB History  Gravida Para Term Preterm AB Living  5 3 3   1 3   SAB TAB Ectopic Multiple Live Births  0   1 0 3    # Outcome Date GA Lbr Len/2nd Weight Sex Delivery Anes PTL Lv  5 Current           4 Ectopic 08/04/18          3 Term 01/01/15 [redacted]w[redacted]d 20:28 / 02:51 4216 g M CS-LTranv EPI N LIV     Complications: Failure to Progress in Second Stage  2 Term 05/24/10 [redacted]w[redacted]d  3317 g F VBAC EPI  LIV  1 Term 06/22/08 [redacted]w[redacted]d  3827 g M CS-Unspec EPI  LIV     Birth Comments: failed forceps    Past Surgical History: Past Surgical History:  Procedure Laterality Date  . CESAREAN SECTION  06/22/2008  . CESAREAN SECTION N/A 01/01/2015   Procedure: CESAREAN SECTION;  Surgeon: Guss Bunde, MD;  Location: [facility];  Service: Obstetrics;  Laterality: N/A;  . LAPAROSCOPIC UNILATERAL SALPINGECTOMY  08/04/2018   Procedure: LAPAROSCOPIC RIGHT SALPINGECTOMY WITH REMOVAL OF ECTOPIC PREGNANCY;  Surgeon: Jonnie Kind, MD;  Location: [facility];  Service: Gynecology;;    Family History: Family History  Problem Relation Age of Onset  . Diabetes Maternal Grandmother   . Hypertension Maternal Grandmother   .  Stroke Maternal Grandmother   . Miscarriages / [place] Mother     Social History: Social History   Tobacco Use  . Smoking status: Former Smoker    Types: Cigarettes  . Smokeless tobacco: Never Used  Substance Use Topics  . Alcohol use: No  . Drug use: No    Allergies: No Known Allergies  Meds:  Medications Prior to Admission  Medication Sig Dispense Refill Last Dose  . ferrous sulfate 325 (65 FE) MG tablet Take 1 tablet (325 mg total) by mouth 2 (two) times daily with a meal. (Patient not taking: Reported on 06/02/2020) 60 tablet 3   . Prenatal 27-1 MG TABS TAKE 1 TABLET BY MOUTH DAILY AT 12 NOON. (Patient not taking: Reported on 06/02/2020) 30 tablet 12     I have reviewed patient's Past Medical Hx, Surgical Hx, Family Hx, Social Hx, medications and allergies.   ROS:  Review of Systems  Constitutional: Negative for chills and fever.  Respiratory: Negative for shortness of breath.   Gastrointestinal: Negative for constipation, diarrhea and nausea.  Genitourinary: Positive for pelvic pain (with UCs) and vaginal discharge. Negative for vaginal bleeding.  Neurological: Negative for dizziness and headaches.   Other systems negative  Physical Exam   Patient Vitals for the past 24 hrs:  Temp Temp src Resp SpO2  06/03/20 2338 98.2 F (36.8 C) Oral 16 100 %   Constitutional: Well-developed, well-nourished female in no acute distress.  Cardiovascular: normal rate and rhythm Respiratory: normal effort, clear to auscultation bilaterally GI: Abd soft, non-tender, gravid appropriate for gestational age.   No rebound or guarding. MS: Extremities nontender, no edema, normal ROM Neurologic: Alert and oriented x 4.  GU: Neg CVAT.  PELVIC EXAM: Cervix pink, visually closed, without lesion, scant white creamy discharge, vaginal walls and external genitalia normal    No pooling, no ferning Dilation: 1 Effacement (%): 60 Station: Ballotable Exam by:: Wynelle Bourgeois, CNM   FHT:   Baseline 140 , moderate variability, accelerations present, no decelerations Contractions: Irregular Occasional   Labs: Results for orders placed or performed during the hospital encounter of 06/03/20 (from the past 24 hour(s))  POCT fern test     Status: Normal   Collection Time: 06/04/20 12:06 AM  Result Value Ref Range   POCT Fern Test Negative = intact amniotic membranes     B/Positive/-- (12/08 1115)  Imaging:  No results found.  MAU Course/MDM: I have ordered labs and reviewed results. Crist Fat is negative NST reviewed, reactive category I  Assessment: Single IUP at [redacted]w[redacted]d Irregular contractions Vaginal discharge, no evidence of ruptured membranes   Plan: Discharge home Labor precautions and fetal kick counts Follow up in Office for prenatal visits   Encouraged to return here or to other Urgent Care/ED if she develops worsening of symptoms, increase in pain, fever, or other concerning symptoms.   Pt stable at time of discharge.  Wynelle Bourgeois CNM, MSN Certified Nurse-Midwife 06/03/2020 11:55 PM

## 2020-06-03 NOTE — MAU Note (Signed)
Pt reports to MAU for contractions and LOF. She states the fluid leaking started a few days ago along with more discharge than usual. Denies VB. endorces movement.

## 2020-06-04 ENCOUNTER — Other Ambulatory Visit (HOSPITAL_COMMUNITY)
Admission: RE | Admit: 2020-06-04 | Discharge: 2020-06-04 | Disposition: A | Discharge status: Home / Self Care | Payer: Medicaid Other | Source: Ambulatory Visit | Attending: Obstetrics & Gynecology | Admitting: Obstetrics & Gynecology

## 2020-06-04 ENCOUNTER — Ambulatory Visit (INDEPENDENT_AMBULATORY_CARE_PROVIDER_SITE_OTHER): Payer: Medicaid Other | Admitting: Obstetrics & Gynecology

## 2020-06-04 VITALS — BP 104/63 | HR 81 | Wt 174.0 lb

## 2020-06-04 DIAGNOSIS — Z3A38 38 weeks gestation of pregnancy: Secondary | ICD-10-CM

## 2020-06-04 DIAGNOSIS — N898 Other specified noninflammatory disorders of vagina: Secondary | ICD-10-CM | POA: Diagnosis not present

## 2020-06-04 DIAGNOSIS — Z98891 History of uterine scar from previous surgery: Secondary | ICD-10-CM

## 2020-06-04 DIAGNOSIS — Z3483 Encounter for supervision of other normal pregnancy, third trimester: Secondary | ICD-10-CM

## 2020-06-04 DIAGNOSIS — O26893 Other specified pregnancy related conditions, third trimester: Secondary | ICD-10-CM | POA: Diagnosis not present

## 2020-06-04 DIAGNOSIS — Z1389 Encounter for screening for other disorder: Secondary | ICD-10-CM

## 2020-06-04 DIAGNOSIS — Z331 Pregnant state, incidental: Secondary | ICD-10-CM

## 2020-06-04 LAB — CBC
HCT: 31.6 % — ABNORMAL LOW (ref 36.0–46.0)
Hemoglobin: 9.6 g/dL — ABNORMAL LOW (ref 12.0–15.0)
MCH: 24.7 pg — ABNORMAL LOW (ref 26.0–34.0)
MCHC: 30.4 g/dL (ref 30.0–36.0)
MCV: 81.2 fL (ref 80.0–100.0)
Platelets: 251 10*3/uL (ref 150–400)
RBC: 3.89 MIL/uL (ref 3.87–5.11)
RDW: 15.8 % — ABNORMAL HIGH (ref 11.5–15.5)
WBC: 8 10*3/uL (ref 4.0–10.5)
nRBC: 0.3 % — ABNORMAL HIGH (ref 0.0–0.2)

## 2020-06-04 LAB — SARS CORONAVIRUS 2 (TAT 6-24 HRS): SARS Coronavirus 2: NEGATIVE

## 2020-06-04 LAB — POCT URINALYSIS DIPSTICK OB
Blood, UA: NEGATIVE
Glucose, UA: NEGATIVE
Ketones, UA: NEGATIVE
Leukocytes, UA: NEGATIVE
Nitrite, UA: NEGATIVE
POC,PROTEIN,UA: NEGATIVE

## 2020-06-04 LAB — TYPE AND SCREEN
ABO/RH(D): B POS
Antibody Screen: NEGATIVE

## 2020-06-04 LAB — POCT FERN TEST: POCT Fern Test: NEGATIVE

## 2020-06-04 NOTE — MAU Note (Signed)
I have communicated with [name], CNM and reviewed vitals Vaginal exam:1 cm, ballotable, 60%   Also reviewed contraction pattern and that non-stress test is reactive.  It has been documented that patient is contracting every 8 minutes. No cervical change since last check in office.  Patient denies any other complaints and requests to go home instead of waiting one hour for a recheck.  Based on this report provider has given order for discharge.  A discharge order and diagnosis entered by a provider.   Labor discharge instructions reviewed with patient.

## 2020-06-04 NOTE — Progress Notes (Signed)
LOW-RISK PREGNANCY VISIT Patient name: [name] MRN [id number]  Date of birth: [birth year] Chief Complaint:   Routine Prenatal Visit  History of Present Illness:   [name] is a 29 y.o. N0N3976 female at [redacted]w[redacted]d with an Estimated Date of Delivery: 06/13/20 being seen today for ongoing management of a low-risk pregnancy.  Depression screen [facility] 2/9 03/24/2020 12/03/2019  Decreased Interest 1 0  Down, Depressed, Hopeless 1 0  PHQ - 2 Score 2 0  Altered sleeping 1 -  Tired, decreased energy 1 -  Change in appetite 0 -  Feeling bad or failure about yourself  1 -  Trouble concentrating 0 -  Moving slowly or fidgety/restless 0 -  Suicidal thoughts 0 -  PHQ-9 Score 5 -    Today she reports . Contractions: Irregular. Vag. Bleeding: None.  Movement: Present. denies leaking of fluid. Review of Systems:   Pertinent items are noted in HPI Denies abnormal vaginal discharge w/ itching/odor/irritation, headaches, visual changes, shortness of breath, chest pain, abdominal pain, severe nausea/vomiting, or problems with urination or bowel movements unless otherwise stated above. Pertinent History Reviewed:  Reviewed past medical,surgical, social, obstetrical and family history.  Reviewed problem list, medications and allergies. Physical Assessment:   Vitals:   06/04/20 1205  BP: 104/63  Pulse: 81  Weight: 174 lb (78.9 kg)  Body mass index is 35.14 kg/m.        Physical Examination:   General appearance: Well appearing, and in no distress  Mental status: Alert, oriented to person, place, and time  Skin: Warm & dry  Cardiovascular: Normal heart rate noted  Respiratory: Normal respiratory effort, no distress  Abdomen: Soft, gravid, nontender  Pelvic: Cervical exam deferred         Extremities: Edema: Trace  Fetal Status: Fetal Heart Rate (bpm): 140 Fundal Height: 40 cm Movement: Present    Chaperone: n/a    Results for orders placed or performed in visit on 06/04/20 (from the past  24 hour(s))  POC Urinalysis Dipstick OB   Collection Time: 06/04/20 12:00 PM  Result Value Ref Range   Color, UA     Clarity, UA     Glucose, UA Negative Negative   Bilirubin, UA     Ketones, UA n    Spec Grav, UA     Blood, UA n    pH, UA     POC,PROTEIN,UA Negative Negative, Trace, Small (1+), Moderate (2+), Large (3+), 4+   Urobilinogen, UA     Nitrite, UA n    Leukocytes, UA Negative Negative   Appearance     Odor    Results for orders placed or performed during the hospital encounter of 06/04/20 (from the past 24 hour(s))  CBC   Collection Time: 06/04/20  9:14 AM  Result Value Ref Range   WBC 8.0 4.0 - 10.5 K/uL   RBC 3.89 3.87 - 5.11 MIL/uL   Hemoglobin 9.6 (L) 12.0 - 15.0 g/dL   HCT 73.4 (L) 36 - 46 %   MCV 81.2 80.0 - 100.0 fL   MCH 24.7 (L) 26.0 - 34.0 pg   MCHC 30.4 30.0 - 36.0 g/dL   RDW 19.3 (H) 79.0 - 24.0 %   Platelets 251 150 - 400 K/uL   nRBC 0.3 (H) 0.0 - 0.2 %  Type and screen [facility]   Collection Time: 06/04/20  9:14 AM  Result Value Ref Range   ABO/RH(D) B POS    Antibody Screen NEG  Sample Expiration      06/07/2020,2359 Performed at [facility], [place] [address] [place], [place] [address]   ABO/Rh   Collection Time: 06/04/20  9:14 AM  Result Value Ref Range   ABO/RH(D)      B POS Performed at [facility] [address], [place], [place] [address]   Results for orders placed or performed during the hospital encounter of 06/03/20 (from the past 24 hour(s))  POCT fern test   Collection Time: 06/04/20 12:06 AM  Result Value Ref Range   POCT Fern Test Negative = intact amniotic membranes     Assessment & Plan:  1) Low-risk pregnancy G5P3013 at [redacted]w[redacted]d with an Estimated Date of Delivery: 06/13/20   2) Previous C section x 2, desires sterilization, left salpoingectomy   Meds: No orders of the defined types were placed in this encounter.  Labs/procedures today:   Plan:  Continue routine obstetrical care   Next visit: prefers in person    Reviewed: Term labor symptoms and general obstetric precautions including but not limited to vaginal bleeding, contractions, leaking of fluid and fetal movement were reviewed in detail with the patient.  All questions were answered. Has home bp cuff. Rx faxed to . Check bp weekly, let us know if >140/90.   Follow-up: Return in about 12 days (around 06/16/2020) for [place], with Dr Elonda Husky.  Orders Placed This Encounter  Procedures  . POC Urinalysis Dipstick OB   Florian Buff CNM, [facility] 06/04/2020 12:19 PM

## 2020-06-04 NOTE — MAU Note (Signed)
Asymptomatic, swab collected, lab called. 

## 2020-06-04 NOTE — Discharge Instructions (Signed)
Labor and Vaginal Delivery  Vaginal delivery means that you give birth by pushing your baby out of your birth canal (vagina). A team of health care providers will help you before, during, and after vaginal delivery. Birth experiences are unique for every woman and every pregnancy, and birth experiences vary depending on where you choose to give birth. What happens when I arrive at the birth center or hospital? Once you are in labor and have been admitted into the hospital or birth center, your health care provider may:  Review your pregnancy history and any concerns that you have.  Insert an IV into one of your veins. This may be used to give you fluids and medicines.  Check your blood pressure, pulse, temperature, and heart rate (vital signs).  Check whether your bag of water (amniotic sac) has broken (ruptured).  Talk with you about your birth plan and discuss pain control options. Monitoring Your health care provider may monitor your contractions (uterine monitoring) and your baby's heart rate (fetal monitoring). You may need to be monitored:  Often, but not continuously (intermittently).  All the time or for long periods at a time (continuously). Continuous monitoring may be needed if: ? You are taking certain medicines, such as medicine to relieve pain or make your contractions stronger. ? You have pregnancy or labor complications. Monitoring may be done by:  Placing a special stethoscope or a handheld monitoring device on your abdomen to check your baby's heartbeat and to check for contractions.  Placing monitors on your abdomen (external monitors) to record your baby's heartbeat and the frequency and length of contractions.  Placing monitors inside your uterus through your vagina (internal monitors) to record your baby's heartbeat and the frequency, length, and strength of your contractions. Depending on the type of monitor, it may remain in your uterus or on your baby's head  until birth.  Telemetry. This is a type of continuous monitoring that can be done with external or internal monitors. Instead of having to stay in bed, you are able to move around during telemetry. Physical exam Your health care provider may perform frequent physical exams. This may include:  Checking how and where your baby is positioned in your uterus.  Checking your cervix to determine: ? Whether it is thinning out (effacing). ? Whether it is opening up (dilating). What happens during labor and delivery?  Normal labor and delivery is divided into the following three stages: Stage 1  This is the longest stage of labor.  This stage can last for hours or days.  Throughout this stage, you will feel contractions. Contractions generally feel mild, infrequent, and irregular at first. They get stronger, more frequent (about every 2-3 minutes), and more regular as you move through this stage.  This stage ends when your cervix is completely dilated to 4 inches (10 cm) and completely effaced. Stage 2  This stage starts once your cervix is completely effaced and dilated and lasts until the delivery of your baby.  This stage may last from 20 minutes to 2 hours.  This is the stage where you will feel an urge to push your baby out of your vagina.  You may feel stretching and burning pain, especially when the widest part of your baby's head passes through the vaginal opening (crowning).  Once your baby is delivered, the umbilical cord will be clamped and cut. This usually occurs after waiting a period of 1-2 minutes after delivery.  Your baby will be placed on your   bare chest (skin-to-skin contact) in an upright position and covered with a warm blanket. Watch your baby for feeding cues, like rooting or sucking, and help the baby to your breast for his or her first feeding. Stage 3  This stage starts immediately after the birth of your baby and ends after you deliver the placenta.  This  stage may take anywhere from 5 to 30 minutes.  After your baby has been delivered, you will feel contractions as your body expels the placenta and your uterus contracts to control bleeding. What can I expect after labor and delivery?  After labor is over, you and your baby will be monitored closely until you are ready to go home to ensure that you are both healthy. Your health care team will teach you how to care for yourself and your baby.  You and your baby will stay in the same room (rooming in) during your hospital stay. This will encourage early bonding and successful breastfeeding.  You may continue to receive fluids and medicines through an IV.  Your uterus will be checked and massaged regularly (fundal massage).  You will have some soreness and pain in your abdomen, vagina, and the area of skin between your vaginal opening and your anus (perineum).  If an incision was made near your vagina (episiotomy) or if you had some vaginal tearing during delivery, cold compresses may be placed on your episiotomy or your tear. This helps to reduce pain and swelling.  You may be given a squirt bottle to use instead of wiping when you go to the bathroom. To use the squirt bottle, follow these steps: ? Before you urinate, fill the squirt bottle with warm water. Do not use hot water. ? After you urinate, while you are sitting on the toilet, use the squirt bottle to rinse the area around your urethra and vaginal opening. This rinses away any urine and blood. ? Fill the squirt bottle with clean water every time you use the bathroom.  It is normal to have vaginal bleeding after delivery. Wear a sanitary pad for vaginal bleeding and discharge. Summary  Vaginal delivery means that you will give birth by pushing your baby out of your birth canal (vagina).  Your health care provider may monitor your contractions (uterine monitoring) and your baby's heart rate (fetal monitoring).  Your health care  provider may perform a physical exam.  Normal labor and delivery is divided into three stages.  After labor is over, you and your baby will be monitored closely until you are ready to go home. This information is not intended to replace advice given to you by your health care provider. Make sure you discuss any questions you have with your health care provider. Document Revised: 01/16/2018 Document Reviewed: 01/16/2018 Elsevier Patient Education  2020 [employer] Inc[employer]  

## 2020-06-05 LAB — ABO/RH: ABO/RH(D): B POS

## 2020-06-05 LAB — RPR: RPR Ser Ql: NONREACTIVE

## 2020-06-06 ENCOUNTER — Encounter (HOSPITAL_COMMUNITY): Payer: Self-pay | Admitting: Obstetrics & Gynecology

## 2020-06-06 ENCOUNTER — Inpatient Hospital Stay (HOSPITAL_COMMUNITY): Payer: Medicaid Other | Admitting: Anesthesiology

## 2020-06-06 ENCOUNTER — Inpatient Hospital Stay (HOSPITAL_COMMUNITY)
Admission: RE | Admit: 2020-06-06 | Discharge: 2020-06-08 | DRG: 785 | Disposition: A | Discharge status: Home / Self Care | Payer: Medicaid Other | Attending: Obstetrics & Gynecology | Admitting: Obstetrics & Gynecology

## 2020-06-06 ENCOUNTER — Encounter (HOSPITAL_COMMUNITY)
Admission: RE | Disposition: A | Discharge status: Home / Self Care | Payer: Self-pay | Source: Home / Self Care | Attending: Obstetrics & Gynecology

## 2020-06-06 ENCOUNTER — Other Ambulatory Visit: Payer: Self-pay

## 2020-06-06 DIAGNOSIS — Z3A39 39 weeks gestation of pregnancy: Secondary | ICD-10-CM | POA: Diagnosis not present

## 2020-06-06 DIAGNOSIS — Z87891 Personal history of nicotine dependence: Secondary | ICD-10-CM | POA: Diagnosis not present

## 2020-06-06 DIAGNOSIS — O00101 Right tubal pregnancy without intrauterine pregnancy: Secondary | ICD-10-CM | POA: Diagnosis present

## 2020-06-06 DIAGNOSIS — Z302 Encounter for sterilization: Secondary | ICD-10-CM | POA: Diagnosis not present

## 2020-06-06 DIAGNOSIS — Z98891 History of uterine scar from previous surgery: Secondary | ICD-10-CM

## 2020-06-06 DIAGNOSIS — O34211 Maternal care for low transverse scar from previous cesarean delivery: Secondary | ICD-10-CM | POA: Diagnosis not present

## 2020-06-06 DIAGNOSIS — Z3009 Encounter for other general counseling and advice on contraception: Secondary | ICD-10-CM | POA: Diagnosis present

## 2020-06-06 LAB — CREATININE, SERUM
Creatinine, Ser: 0.6 mg/dL (ref 0.44–1.00)
GFR calc Af Amer: >60 mL/min (ref >60)
GFR calc non Af Amer: >60 mL/min (ref >60)

## 2020-06-06 SURGERY — Surgical Case
Anesthesia: Spinal

## 2020-06-06 MED ORDER — FENTANYL CITRATE (PF) 100 MCG/2ML IJ SOLN
INTRAMUSCULAR | Status: AC
  Filled 2020-06-06: qty 2

## 2020-06-06 MED ORDER — DIBUCAINE (PERIANAL) 1 % EX OINT: 1.0000 "application " | TOPICAL_OINTMENT | CUTANEOUS | Status: DC | PRN

## 2020-06-06 MED ORDER — DIPHENHYDRAMINE HCL 25 MG PO CAPS: 25.0000 mg | ORAL_CAPSULE | Freq: Four times a day (QID) | ORAL | Status: DC | PRN

## 2020-06-06 MED ORDER — OXYTOCIN-SODIUM CHLORIDE 30-0.9 UT/500ML-% IV SOLN
INTRAVENOUS | Status: AC
  Filled 2020-06-06: qty 500

## 2020-06-06 MED ORDER — DEXAMETHASONE SODIUM PHOSPHATE 10 MG/ML IJ SOLN
INTRAMUSCULAR | Status: AC
  Filled 2020-06-06: qty 1

## 2020-06-06 MED ORDER — COCONUT OIL OIL: 1.0000 "application " | TOPICAL_OIL | Status: DC | PRN

## 2020-06-06 MED ORDER — OXYTOCIN-SODIUM CHLORIDE 30-0.9 UT/500ML-% IV SOLN
INTRAVENOUS | Status: DC | PRN
  Administered 2020-06-06: 30 [IU] via INTRAVENOUS

## 2020-06-06 MED ORDER — PHENYLEPHRINE HCL-NACL 20-0.9 MG/250ML-% IV SOLN
INTRAVENOUS | Status: DC | PRN
  Administered 2020-06-06: 60 ug/min via INTRAVENOUS

## 2020-06-06 MED ORDER — MORPHINE SULFATE (PF) 0.5 MG/ML IJ SOLN
INTRAMUSCULAR | Status: AC
  Filled 2020-06-06: qty 10

## 2020-06-06 MED ORDER — ONDANSETRON HCL 4 MG/2ML IJ SOLN: 4.0000 mg | Freq: Three times a day (TID) | INTRAMUSCULAR | Status: DC | PRN

## 2020-06-06 MED ORDER — PRENATAL MULTIVITAMIN CH
1.0000 | ORAL_TABLET | Freq: Every day | ORAL | Status: DC
  Administered 2020-06-06 – 2020-06-08 (×3): 1 via ORAL
  Filled 2020-06-06 (×3): qty 1

## 2020-06-06 MED ORDER — SCOPOLAMINE 1 MG/3DAYS TD PT72
MEDICATED_PATCH | TRANSDERMAL | Status: AC
  Filled 2020-06-06: qty 1

## 2020-06-06 MED ORDER — DIPHENHYDRAMINE HCL 25 MG PO CAPS: 25.0000 mg | ORAL_CAPSULE | ORAL | Status: DC | PRN

## 2020-06-06 MED ORDER — WITCH HAZEL-GLYCERIN EX PADS: 1.0000 "application " | MEDICATED_PAD | CUTANEOUS | Status: DC | PRN

## 2020-06-06 MED ORDER — CEFAZOLIN SODIUM-DEXTROSE 2-4 GM/100ML-% IV SOLN: 2.0000 g | INTRAVENOUS | Status: DC

## 2020-06-06 MED ORDER — KETOROLAC TROMETHAMINE 30 MG/ML IJ SOLN
30.0000 mg | Freq: Once | INTRAMUSCULAR | Status: AC | PRN
  Administered 2020-06-06: 30 mg via INTRAVENOUS

## 2020-06-06 MED ORDER — SIMETHICONE 80 MG PO CHEW
80.0000 mg | CHEWABLE_TABLET | ORAL | Status: DC
  Administered 2020-06-06 – 2020-06-07 (×2): 80 mg via ORAL
  Filled 2020-06-06 (×2): qty 1

## 2020-06-06 MED ORDER — LACTATED RINGERS IV SOLN: INTRAVENOUS | Status: DC

## 2020-06-06 MED ORDER — IBUPROFEN 800 MG PO TABS
800.0000 mg | ORAL_TABLET | Freq: Four times a day (QID) | ORAL | Status: DC
  Administered 2020-06-07 – 2020-06-08 (×4): 800 mg via ORAL
  Filled 2020-06-06 (×4): qty 1

## 2020-06-06 MED ORDER — NALBUPHINE HCL 10 MG/ML IJ SOLN: 5.0000 mg | INTRAMUSCULAR | Status: DC | PRN

## 2020-06-06 MED ORDER — PROMETHAZINE HCL 25 MG/ML IJ SOLN: 6.2500 mg | INTRAMUSCULAR | Status: DC | PRN

## 2020-06-06 MED ORDER — SODIUM CHLORIDE 0.9 % IR SOLN
Status: DC | PRN
  Administered 2020-06-06: 1000 mL

## 2020-06-06 MED ORDER — DIPHENHYDRAMINE HCL 50 MG/ML IJ SOLN: 12.5000 mg | INTRAMUSCULAR | Status: DC | PRN

## 2020-06-06 MED ORDER — SCOPOLAMINE 1 MG/3DAYS TD PT72
1.0000 | MEDICATED_PATCH | Freq: Once | TRANSDERMAL | Status: DC
  Administered 2020-06-06: 1.5 mg via TRANSDERMAL

## 2020-06-06 MED ORDER — ZOLPIDEM TARTRATE 5 MG PO TABS: 5.0000 mg | ORAL_TABLET | Freq: Every evening | ORAL | Status: DC | PRN

## 2020-06-06 MED ORDER — TETANUS-DIPHTH-ACELL PERTUSSIS 5-2.5-18.5 LF-MCG/0.5 IM SUSP: 0.5000 mL | Freq: Once | INTRAMUSCULAR | Status: DC

## 2020-06-06 MED ORDER — DEXAMETHASONE SODIUM PHOSPHATE 10 MG/ML IJ SOLN
INTRAMUSCULAR | Status: DC | PRN
Start: 2020-06-06 — End: 2020-06-06
  Administered 2020-06-06: 10 mg via INTRAVENOUS

## 2020-06-06 MED ORDER — BUPIVACAINE IN DEXTROSE 0.75-8.25 % IT SOLN
INTRATHECAL | Status: DC | PRN
  Administered 2020-06-06: 1.4 mL via INTRATHECAL

## 2020-06-06 MED ORDER — LACTATED RINGERS IV SOLN: INTRAVENOUS | Status: DC | PRN

## 2020-06-06 MED ORDER — OXYCODONE HCL 5 MG/5ML PO SOLN: 5.0000 mg | Freq: Once | ORAL | Status: DC | PRN

## 2020-06-06 MED ORDER — ONDANSETRON HCL 4 MG/2ML IJ SOLN
INTRAMUSCULAR | Status: AC
  Filled 2020-06-06: qty 2

## 2020-06-06 MED ORDER — SIMETHICONE 80 MG PO CHEW: 80.0000 mg | CHEWABLE_TABLET | ORAL | Status: DC | PRN

## 2020-06-06 MED ORDER — CEFAZOLIN SODIUM-DEXTROSE 2-4 GM/100ML-% IV SOLN
INTRAVENOUS | Status: AC
  Filled 2020-06-06: qty 100

## 2020-06-06 MED ORDER — HYDROMORPHONE HCL 1 MG/ML IJ SOLN: 0.2000 mg | INTRAMUSCULAR | Status: DC | PRN

## 2020-06-06 MED ORDER — NALBUPHINE HCL 10 MG/ML IJ SOLN: 5.0000 mg | Freq: Once | INTRAMUSCULAR | Status: DC | PRN

## 2020-06-06 MED ORDER — ONDANSETRON HCL 4 MG/2ML IJ SOLN
INTRAMUSCULAR | Status: DC | PRN
  Administered 2020-06-06: 4 mg via INTRAVENOUS

## 2020-06-06 MED ORDER — MENTHOL 3 MG MT LOZG: 1.0000 | LOZENGE | OROMUCOSAL | Status: DC | PRN

## 2020-06-06 MED ORDER — CEFAZOLIN SODIUM-DEXTROSE 2-3 GM-%(50ML) IV SOLR
INTRAVENOUS | Status: DC | PRN
  Administered 2020-06-06: 2 g via INTRAVENOUS

## 2020-06-06 MED ORDER — NALOXONE HCL 4 MG/10ML IJ SOLN
1.0000 ug/kg/h | INTRAVENOUS | Status: DC | PRN
  Filled 2020-06-06: qty 5

## 2020-06-06 MED ORDER — ACETAMINOPHEN 500 MG PO TABS
1000.0000 mg | ORAL_TABLET | Freq: Four times a day (QID) | ORAL | Status: DC
  Administered 2020-06-06 – 2020-06-08 (×9): 1000 mg via ORAL
  Filled 2020-06-06 (×9): qty 2

## 2020-06-06 MED ORDER — OXYCODONE HCL 5 MG PO TABS: 5.0000 mg | ORAL_TABLET | Freq: Once | ORAL | Status: DC | PRN

## 2020-06-06 MED ORDER — SODIUM CHLORIDE 0.9 % IV SOLN: INTRAVENOUS | Status: DC | PRN

## 2020-06-06 MED ORDER — NALOXONE HCL 0.4 MG/ML IJ SOLN: 0.4000 mg | INTRAMUSCULAR | Status: DC | PRN

## 2020-06-06 MED ORDER — SENNOSIDES-DOCUSATE SODIUM 8.6-50 MG PO TABS
2.0000 | ORAL_TABLET | ORAL | Status: DC
  Administered 2020-06-06 – 2020-06-07 (×2): 2 via ORAL
  Filled 2020-06-06 (×2): qty 2

## 2020-06-06 MED ORDER — KETOROLAC TROMETHAMINE 30 MG/ML IJ SOLN
30.0000 mg | Freq: Four times a day (QID) | INTRAMUSCULAR | Status: AC
  Administered 2020-06-06 – 2020-06-07 (×4): 30 mg via INTRAVENOUS
  Filled 2020-06-06 (×4): qty 1

## 2020-06-06 MED ORDER — SIMETHICONE 80 MG PO CHEW
80.0000 mg | CHEWABLE_TABLET | Freq: Three times a day (TID) | ORAL | Status: DC
  Administered 2020-06-06 – 2020-06-08 (×6): 80 mg via ORAL
  Filled 2020-06-06 (×6): qty 1

## 2020-06-06 MED ORDER — FENTANYL CITRATE (PF) 100 MCG/2ML IJ SOLN
INTRAMUSCULAR | Status: DC | PRN
  Administered 2020-06-06: 15 ug via INTRATHECAL

## 2020-06-06 MED ORDER — ENOXAPARIN SODIUM 40 MG/0.4ML ~~LOC~~ SOLN
40.0000 mg | SUBCUTANEOUS | Status: DC
  Administered 2020-06-06 – 2020-06-07 (×2): 40 mg via SUBCUTANEOUS
  Filled 2020-06-06 (×2): qty 0.4

## 2020-06-06 MED ORDER — KETOROLAC TROMETHAMINE 30 MG/ML IJ SOLN
INTRAMUSCULAR | Status: AC
  Filled 2020-06-06: qty 1

## 2020-06-06 MED ORDER — HYDROMORPHONE HCL 1 MG/ML IJ SOLN: 0.2500 mg | INTRAMUSCULAR | Status: DC | PRN

## 2020-06-06 MED ORDER — SODIUM CHLORIDE 0.9% FLUSH: 3.0000 mL | INTRAVENOUS | Status: DC | PRN

## 2020-06-06 MED ORDER — MEPERIDINE HCL 25 MG/ML IJ SOLN: 6.2500 mg | INTRAMUSCULAR | Status: DC | PRN

## 2020-06-06 MED ORDER — OXYCODONE HCL 5 MG PO TABS
5.0000 mg | ORAL_TABLET | ORAL | Status: DC | PRN
  Administered 2020-06-07 (×2): 5 mg via ORAL
  Filled 2020-06-06 (×2): qty 1

## 2020-06-06 MED ORDER — OXYTOCIN-SODIUM CHLORIDE 30-0.9 UT/500ML-% IV SOLN
2.5000 [IU]/h | INTRAVENOUS | Status: AC
  Administered 2020-06-06: 2.5 [IU]/h via INTRAVENOUS

## 2020-06-06 MED ORDER — MORPHINE SULFATE (PF) 0.5 MG/ML IJ SOLN
INTRAMUSCULAR | Status: DC | PRN
  Administered 2020-06-06: 150 ug via INTRATHECAL

## 2020-06-06 SURGICAL SUPPLY — 40 items
BLADE SURG 10 STRL SS (BLADE) ×3 IMPLANT
CHLORAPREP W/TINT 26ML (MISCELLANEOUS) ×3 IMPLANT
CLAMP CORD UMBIL (MISCELLANEOUS) IMPLANT
CLOTH BEACON ORANGE TIMEOUT ST (SAFETY) ×3 IMPLANT
DERMABOND ADHESIVE PROPEN (GAUZE/BANDAGES/DRESSINGS) ×2
DERMABOND ADVANCED (GAUZE/BANDAGES/DRESSINGS) ×4
DERMABOND ADVANCED .7 DNX12 (GAUZE/BANDAGES/DRESSINGS) ×2 IMPLANT
DERMABOND ADVANCED .7 DNX6 (GAUZE/BANDAGES/DRESSINGS) ×1 IMPLANT
DRSG OPSITE POSTOP 4X10 (GAUZE/BANDAGES/DRESSINGS) ×3 IMPLANT
ELECT REM PT RETURN 9FT ADLT (ELECTROSURGICAL) ×3
ELECTRODE REM PT RTRN 9FT ADLT (ELECTROSURGICAL) ×1 IMPLANT
EXTRACTOR VACUUM BELL STYLE (SUCTIONS) IMPLANT
GLOVE BIOGEL PI IND STRL 7.0 (GLOVE) ×1 IMPLANT
GLOVE BIOGEL PI IND STRL 8 (GLOVE) ×1 IMPLANT
GLOVE BIOGEL PI INDICATOR 7.0 (GLOVE) ×2
GLOVE BIOGEL PI INDICATOR 8 (GLOVE) ×2
GLOVE ECLIPSE 8.0 STRL XLNG CF (GLOVE) ×3 IMPLANT
GOWN STRL REUS W/TWL LRG LVL3 (GOWN DISPOSABLE) ×6 IMPLANT
KIT ABG SYR 3ML LUER SLIP (SYRINGE) ×3 IMPLANT
NEEDLE HYPO 18GX1.5 BLUNT FILL (NEEDLE) ×3 IMPLANT
NEEDLE HYPO 22GX1.5 SAFETY (NEEDLE) ×3 IMPLANT
NEEDLE HYPO 25X5/8 SAFETYGLIDE (NEEDLE) ×3 IMPLANT
NS IRRIG 1000ML POUR BTL (IV SOLUTION) ×3 IMPLANT
PACK C SECTION WH (CUSTOM PROCEDURE TRAY) ×3 IMPLANT
PAD OB MATERNITY 4.3X12.25 (PERSONAL CARE ITEMS) ×3 IMPLANT
PENCIL SMOKE EVAC W/HOLSTER (ELECTROSURGICAL) ×3 IMPLANT
RTRCTR C-SECT PINK 25CM LRG (MISCELLANEOUS) IMPLANT
SUT CHROMIC 0 CT 1 (SUTURE) ×3 IMPLANT
SUT MNCRL 0 VIOLET CTX 36 (SUTURE) ×2 IMPLANT
SUT MONOCRYL 0 CTX 36 (SUTURE) ×6
SUT PLAIN 2 0 (SUTURE)
SUT PLAIN 2 0 XLH (SUTURE) IMPLANT
SUT PLAIN ABS 2-0 CT1 27XMFL (SUTURE) IMPLANT
SUT VIC AB 0 CTX 36 (SUTURE) ×3
SUT VIC AB 0 CTX36XBRD ANBCTRL (SUTURE) ×1 IMPLANT
SUT VIC AB 4-0 KS 27 (SUTURE) IMPLANT
SYR 20CC LL (SYRINGE) ×6 IMPLANT
TOWEL OR 17X24 6PK STRL BLUE (TOWEL DISPOSABLE) ×3 IMPLANT
TRAY FOLEY W/BAG SLVR 14FR LF (SET/KITS/TRAYS/PACK) IMPLANT
WATER STERILE IRR 1000ML POUR (IV SOLUTION) ×3 IMPLANT

## 2020-06-06 NOTE — Transfer of Care (Signed)
Immediate Anesthesia Transfer of Care Note  Patient: [name]  Procedure(s) Performed: CESAREAN SECTION ( WITH LEFT SALPINGECTOMY) (N/A )  Patient Location: PACU  Anesthesia Type:Spinal  Level of Consciousness: awake, alert  and oriented  Airway & Oxygen Therapy: Patient Spontanous Breathing  Post-op Assessment: Report given to RN and Post -op Vital signs reviewed and stable  Post vital signs: Reviewed and stable  Last Vitals:  Vitals Value Taken Time  BP 188/158 06/06/20 0850  Temp    Pulse 71 06/06/20 0852  Resp 20 06/06/20 0852  SpO2 100 % 06/06/20 0852  Vitals shown include unvalidated device data.  Last Pain:  Vitals:   06/06/20 0629  TempSrc: Oral         Complications: No complications documented.

## 2020-06-06 NOTE — Anesthesia Procedure Notes (Signed)
Spinal  Patient location during procedure: OB Start time: 06/06/2020 7:35 AM End time: 06/06/2020 7:40 AM Staffing Performed: anesthesiologist  Anesthesiologist: Lowella Curb, MD Preanesthetic Checklist Completed: patient identified, IV checked, risks and benefits discussed, surgical consent, monitors and equipment checked, pre-op evaluation and timeout performed Spinal Block Patient position: sitting Prep: DuraPrep and site prepped and draped Patient monitoring: heart rate, cardiac monitor, continuous pulse ox and blood pressure Approach: midline Location: L3-4 Injection technique: single-shot Needle Needle type: Pencan  Needle gauge: 24 G Needle length: 10 cm Assessment Sensory level: T4

## 2020-06-06 NOTE — Anesthesia Postprocedure Evaluation (Signed)
Anesthesia Post Note  Patient: [name]  Procedure(s) Performed: CESAREAN SECTION ( WITH LEFT SALPINGECTOMY) (N/A )     Patient location during evaluation: PACU Anesthesia Type: Spinal Level of consciousness: awake and alert Pain management: pain level controlled Vital Signs Assessment: post-procedure vital signs reviewed and stable Respiratory status: spontaneous breathing, nonlabored ventilation and respiratory function stable Cardiovascular status: blood pressure returned to baseline and stable Postop Assessment: no apparent nausea or vomiting Anesthetic complications: no   No complications documented.  Last Vitals:  Vitals:   06/06/20 0945 06/06/20 1016  BP: 101/74 106/68  Pulse: 75 72  Resp: 19 18  Temp:  36.9 C  SpO2: 100% 100%    Last Pain:  Vitals:   06/06/20 1016  TempSrc: Oral  PainSc:    Pain Goal:                Epidural/Spinal Function Cutaneous sensation: Tingles (06/06/20 0945), Patient able to flex knees: Yes (06/06/20 0945), Patient able to lift hips off bed: No (06/06/20 0945), Back pain beyond tenderness at insertion site: No (06/06/20 0945), Progressively worsening motor and/or sensory loss: No (06/06/20 0945), Bowel and/or bladder incontinence post epidural: No (06/06/20 0945)  Lowella Curb

## 2020-06-06 NOTE — Discharge Instructions (Signed)

## 2020-06-06 NOTE — Lactation Note (Signed)
This note was copied from a baby's chart. Lactation Consultation Note Baby 13 hrs old. Baby sleeping soundly STS on mom's chest. Attempted to BF, baby had no interest in BF.  Attempted suck training w/gloved finger, baby wouldn't suckle on finger. Noted baby has a slight recessed chin. Suggested mom to do chin tug after latching to obtain deep latch.  Mom had c-section, suggested football hold to get large baby off of abd. For feeding. Mom stated it hurt when he lays on it.  Newborn behavior, feeding habits, STS, I&O, breast massage, supply and demand  Discussed. Mom stated baby has had a good feeding. Mom encouraged to feed baby 8-12 times/24 hours and with feeding cues.   Mom shown how to use DEBP & how to disassemble, clean, & reassemble parts. Mom knows to pump q3h for 15-20 min. D/t Hx: of low milk supply suggest mom pump. Mom agreed.  Mom has med/lg. Everted nipples. #27 flange was comfortable for mom. Hand expression taught, no colostrum noted at this time. Mom stated she has been trying to hand express and has been unable to see colostrum.  Mom BF her 1st child now 65 yrs old for 1 month, her 2nd child 78 yrs old for 1 week and 3rd child 20 yrs old for 2 days. Mom stated she had to supplement w/all 3 children.  Encouraged mom to wake baby try to BF q3 hrs if baby hasn't cued. Discussed stimulation techniques. Encouraged mom to call for assistance or questions. Mom prefers Lactation brochure in [place].   Patient Name: [name] LOVFI'E Date: 06/06/2020 Reason for consult: Initial assessment;Term   Maternal Data Has patient been taught Hand Expression?: Yes Does the patient have breastfeeding experience prior to this delivery?: Yes  Feeding Feeding Type: Breast Fed  LATCH Score Latch: Too sleepy or reluctant, no latch achieved, no sucking elicited.  Audible Swallowing: None  Type of Nipple: Everted at rest and after stimulation  Comfort (Breast/Nipple): Soft  / non-tender  Hold (Positioning): Full assist, staff holds infant at breast  LATCH Score: 4  Interventions Interventions: Breast feeding basics reviewed;Support pillows;Assisted with latch;Position options;Skin to skin;Breast massage;Hand express;Breast compression;Adjust position;DEBP  Lactation Tools Discussed/Used Tools: Pump Breast pump type: Double-Electric Breast Pump WIC Program: Yes Pump Review: Setup, frequency, and cleaning;Milk Storage Initiated by:: Peri Jefferson RN IBCLC Date initiated:: 06/06/20   Consult Status Consult Status: Follow-up Date: 06/07/20 Follow-up type: In-patient    Charyl Dancer 06/06/2020, 9:16 PM

## 2020-06-06 NOTE — H&P (Signed)
OBSTETRIC ADMISSION HISTORY AND PHYSICAL  [name] is a 29 y.o. female [phone] with IUP at [redacted]w[redacted]d presenting for repeat elective Cesarean delivery. She reports +FMs. No LOF, VB, blurry vision, headaches, peripheral edema, or RUQ pain. She plans on breastfeeding. She requests Salpingectomy for birth control.  Dating: By 8 week [place] --->  Estimated Date of Delivery: 06/13/20  Sono:   @[redacted]w[redacted]d , normal anatomy, cephalic presentation, 245g, 51%ile   Prenatal History/Complications: H/o Cesarean delivery  Past Medical History: Past Medical History:  Diagnosis Date   Medical history non-contributory     Past Surgical History: Past Surgical History:  Procedure Laterality Date   CESAREAN SECTION  06/22/2008   CESAREAN SECTION N/A 01/01/2015   Procedure: CESAREAN SECTION;  Surgeon: 03/02/2015, MD;  Location: [facility];  Service: Obstetrics;  Laterality: N/A;   LAPAROSCOPIC UNILATERAL SALPINGECTOMY  08/04/2018   Procedure: LAPAROSCOPIC RIGHT SALPINGECTOMY WITH REMOVAL OF ECTOPIC PREGNANCY;  Surgeon: 10/04/2018, MD;  Location: [facility];  Service: Gynecology;;    Obstetrical History: OB History    Gravida  5   Para  3   Term  3   Preterm      AB  1   Living  3     SAB  0   TAB      Ectopic  1   Multiple  0   Live Births  3           Social History: Social History   Socioeconomic History   Marital status: Married    Spouse name: Not on file   Number of children: Not on file   Years of education: Not on file   Highest education level: Not on file  Occupational History   Not on file  Tobacco Use   Smoking status: Former Smoker    Types: Cigarettes   Smokeless tobacco: Never Used  Tilda Burrow Use: Never used  Substance and Sexual Activity   Alcohol use: No   Drug use: No   Sexual activity: Yes    Birth control/protection: None  Other Topics Concern   Not on file  Social History Narrative   Not on file   Social Determinants  of Health   Financial Resource Strain: Low Risk    Difficulty of Paying Living Expenses: Not hard at all  Food Insecurity: No Food Insecurity   Worried About Building services engineer in the Last Year: Never true   Ran Out of Food in the Last Year: Never true  Transportation Needs: No Transportation Needs   Lack of Transportation (Medical): No   Lack of Transportation (Non-Medical): No  Physical Activity: Inactive   Days of Exercise per Week: 1 day   Minutes of Exercise per Session: 0 min  Stress: No Stress Concern Present   Feeling of Stress : Not at all  Social Connections: Moderately Integrated   Frequency of Communication with Friends and Family: More than three times a week   Frequency of Social Gatherings with Friends and Family: Once a week   Attends Religious Services: 1 to 4 times per year   Active Member of Programme researcher, broadcasting/film/video or Organizations: No   Attends [employer]: Never   Marital Status: Married    Family History: Family History  Problem Relation Age of Onset   Diabetes Maternal Grandmother    Hypertension Maternal Grandmother    Stroke Maternal Grandmother    Miscarriages / Engineer, structural Mother     Allergies: Not  on File  Medications Prior to Admission  Medication Sig Dispense Refill Last Dose   ferrous sulfate 325 (65 FE) MG tablet Take 1 tablet (325 mg total) by mouth 2 (two) times daily with a meal. (Patient not taking: Reported on 06/02/2020) 60 tablet 3 Not Taking at Unknown time   Prenatal 27-1 MG TABS TAKE 1 TABLET BY MOUTH DAILY AT 12 NOON. (Patient not taking: Reported on 06/02/2020) 30 tablet 12 Not Taking at Unknown time     Review of Systems:  All systems reviewed and negative except as stated in HPI  PE: Blood pressure 113/60, pulse 80, temperature 98.2 F (36.8 C), temperature source Oral, resp. rate 20, height 4\' 11"  (1.499 m), weight 78.9 kg, last menstrual period 08/02/2019, SpO2 100 %. General appearance: alert,  cooperative and appears stated age Lungs: regular rate and effort Heart: regular rate  Abdomen: soft, non-tender Extremities: Homans sign is negative, no sign of DVT Presentation: unsure Fetal Heart Tones confirmed by Doppler  Prenatal labs: ABO, Rh: --/--/B POS, B POS Performed at [facility], [address], [place], [place] [address]  [phone] Antibody: NEG (06/10 0914) Rubella: 2.40 (12/08 1115) RPR: NON REACTIVE (06/10 0914)  HBsAg: Negative (12/08 1115)  HIV: Non Reactive (03/30 0926)  GBS: Negative/-- (06/03 1330)  2 hr GTT 84/85/104  Prenatal Transfer Tool  Maternal Diabetes: No Genetic Screening: Normal Maternal Ultrasounds/Referrals: Normal Fetal Ultrasounds or other Referrals:  None Maternal Substance Abuse:  No Significant Maternal Medications:  None Significant Maternal Lab Results: Group B Strep negative  No results found for this or any previous visit (from the past 24 hour(s)).  Patient Active Problem List   Diagnosis Date Noted   Depression 04/14/2020   Marijuana use 12/05/2019   Supervision of normal pregnancy 12/03/2019   Right tubal pregnancy without intrauterine pregnancy 08/04/2018   Previous cesarean section 06/11/2014    Assessment: [name] is a 29 y.o. [id number] at [redacted]w[redacted]d here for Repeat Elective Cesarean Delivery   Plan: The risks of cesarean section were discussed with the patient including but were not limited to: bleeding which may require transfusion or reoperation; infection which may require antibiotics; injury to bowel, bladder, ureters or other surrounding organs; injury to the fetus; need for additional procedures including hysterectomy in the event of a life-threatening hemorrhage; placental abnormalities wth subsequent pregnancies, incisional problems, thromboembolic phenomenon and other postoperative/anesthesia complications.  Patient also desires permanent sterilization.  Other reversible forms of contraception were  discussed with patient; she declines all other modalities. Risks of procedure discussed with patient including but not limited to: risk of regret, permanence of method, bleeding, infection, injury to surrounding organs and need for additional procedures.  Failure risk of about 1% with increased risk of ectopic gestation if pregnancy occurs was also discussed with patient.  Also discussed possibility of post-tubal pain syndrome. The patient concurred with the proposed plan, giving informed written consent for the procedures.  Patient has been NPO since last night she will remain NPO for procedure. Anesthesia and OR aware.  Preoperative prophylactic antibiotics and SCDs ordered on call to the OR.  To OR when ready.   Brad Mcgaughy L Lenah Messenger, DO  06/06/2020, 7:10 AM

## 2020-06-06 NOTE — Discharge Summary (Signed)
Postpartum Discharge Summary     Patient Name: [name] DOB: 1991/08/23 MRN: [id number]  Date of admission: 06/06/2020 Delivery date:06/06/2020  Delivering provider: Florian Buff  Date of discharge: 06/08/2020  Admitting diagnosis: Cesarean delivery delivered [O82] Intrauterine pregnancy: [redacted]w[redacted]d    Secondary diagnosis:  Principal Problem:   Cesarean delivery delivered Active Problems:   Previous cesarean section   Right tubal pregnancy without intrauterine pregnancy   Unwanted fertility  Additional problems: None    Discharge diagnosis: Term Pregnancy Delivered                                              Post partum procedures:None Augmentation: N/A Complications: None  Hospital course: Sceduled C/S   29y.o. yo [id number] admitted to the hospital 06/06/2020 for scheduled cesarean section with the following indication:Elective Repeat.Delivery details are as follows:  Membrane Rupture Time/Date: 8:02 AM ,06/06/2020   Delivery Method:C-Section, Low Transverse  Details of operation can be found in separate operative note. BTL done intraoperatively. Received IV iron post-partum. Zoloft prescribed on discharge and to f/u at [facility].  Patient had an uncomplicated postpartum course.  She is ambulating, tolerating a regular diet, passing flatus, and urinating well. Patient is discharged home in stable condition on  06/08/20        Newborn Data: Birth date:06/06/2020  Birth time:8:03 AM  Gender:Female  Living status:Living  Apgars:8 ,9  Weight:4140 g     Magnesium Sulfate received: No BMZ received: No Rhophylac:N/A MMR:N/A T-DaP:Given prenatally Flu: N/A Transfusion:No (IV iron only)  Physical exam  Vitals:   06/06/20 2348 06/07/20 0352 06/07/20 1355 06/07/20 2229  BP: (!) 96/54 (!) 108/54 (!) 91/54 (!) 104/56  Pulse: 68 60 72 63  Resp: _0 Temp: 97.8 F (36.6 C)  99 F (37.2 C) 98.6 F (37 C)  TempSrc: Oral  Oral Oral  SpO2: 99% 100% 96%   Weight:       Height:       General: alert, cooperative and no distress Lochia: appropriate Uterine Fundus: firm Incision: Healing well with no significant drainage, No significant erythema, Dressing is clean, dry, and intact DVT Evaluation: No evidence of DVT seen on physical exam. Labs: Lab Results  Component Value Date   WBC 14.4 (H) 06/07/2020   HGB 7.8 (L) 06/07/2020   HCT 25.2 (L) 06/07/2020   MCV 81.3 06/07/2020   PLT 229 06/07/2020   CMP Latest Ref Rng & Units 06/06/2020  Glucose 70 - 99 mg/dL -  BUN 6 - 20 mg/dL -  Creatinine 0.44 - 1.00 mg/dL 0.60  Sodium 135 - 145 mmol/L -  Potassium 3.5 - 5.1 mmol/L -  Chloride 98 - 111 mmol/L -  CO2 22 - 32 mmol/L -  Calcium 8.9 - 10.3 mg/dL -  Total Protein 6.5 - 8.1 g/dL -  Total Bilirubin 0.3 - 1.2 mg/dL -  Alkaline Phos 38 - 126 U/L -  AST 15 - 41 U/L -  ALT 0 - 44 U/L -   Edinburgh Score: Edinburgh Postnatal Depression Scale Screening Tool 06/07/2020  I have been able to laugh and see the funny side of things. 0  I have looked forward with enjoyment to things. 0  I have blamed myself unnecessarily when things went wrong. 2  I have been anxious or worried  for no good reason. 2  I have felt scared or panicky for no good reason. 2  Things have been getting on top of me. 2  I have been so unhappy that I have had difficulty sleeping. 1  I have felt sad or miserable. 1  I have been so unhappy that I have been crying. 1  The thought of harming myself has occurred to me. 0  Edinburgh Postnatal Depression Scale Total 11     After visit meds:  Allergies as of 06/08/2020   No Known Allergies     Medication List    TAKE these medications   acetaminophen 500 MG tablet Commonly known as: TYLENOL Take 1 tablet (500 mg total) by mouth every 6 (six) hours as needed for mild pain.   ferrous sulfate 325 (65 FE) MG tablet Take 1 tablet (325 mg total) by mouth daily with breakfast. What changed: when to take this   ibuprofen 800 MG  tablet Commonly known as: ADVIL Take 1 tablet (800 mg total) by mouth every 6 (six) hours.   oxyCODONE 5 MG immediate release tablet Commonly known as: Oxy IR/ROXICODONE Take 1-2 tablets (5-10 mg total) by mouth every 4 (four) hours as needed for moderate pain.   Prenatal 27-1 MG Tabs TAKE 1 TABLET BY MOUTH DAILY AT 12 NOON.   senna-docusate 8.6-50 MG tablet Commonly known as: Senokot-S Take 2 tablets by mouth daily. Start taking on: June 09, 2020   sertraline 25 MG tablet Commonly known as: ZOLOFT Take 1 tablet (25 mg total) by mouth daily.        Discharge home in stable condition Infant Feeding: Breast Infant Disposition:home with mother Discharge instruction: per After Visit Summary and Postpartum booklet. Activity: Advance as tolerated. Pelvic rest for 6 weeks.  Diet: routine diet Future Appointments: Future Appointments  Date Time Provider [place]  06/16/2020  1:50 PM Florian Buff, MD [facility]   Follow up Visit:   Please schedule this patient for a In person postpartum visit in 4 weeks with the following provider: Any provider. Additional Postpartum F/U:Incision check 1 week  Low risk pregnancy complicated by: H/o Cesarean delivery Delivery mode:  C-Section, Low Transverse  Anticipated Birth Control:  Salpingectomy   06/08/2020 Chauncey Mann, MD

## 2020-06-06 NOTE — Op Note (Signed)
Operative Note   SURGERY DATE: 06/06/2020  PRE-OP DIAGNOSIS:  *Pregnancy at 39 weeks *H/o Cesarean delivery *Unwanted fertility  POST-OP DIAGNOSIS:  *Pregnancy at 39 weeks *H/o Cesarean delivery *Unwanted fertility   PROCEDURE: repeat low transverse cesarean section via pfannenstiel skin incision with single layer uterine closure, left fimbriectomy  SURGEON: Surgeon(s) and Role:    * Eure, Amaryllis Dyke, MD - Primary    * Zak Gondek L, DO - Assisting  ASSISTANT: None  ANESTHESIA: spinal  ESTIMATED BLOOD LOSS: 154 mL  DRAINS: 150 mL UOP via indwelling foley  TOTAL IV FLUIDS: 1200 mL crystalloid  VTE PROPHYLAXIS: SCDs to bilateral lower extremities  ANTIBIOTICS: Two grams of Cefazolin were given, within 1 hour of skin incision  SPECIMENS: None  COMPLICATIONS: None immediate  INDICATIONS: Repeat elective Cesarean delivery, Unwanted fertility  FINDINGS: No intra-abdominal adhesions were noted. Grossly normal uterus, left tube and ovaries. The right tube was surgically absent. Large vascularities noted in the left mesosalpinx. Clear amniotic fluid, cephalic female infant, weight per medical record, APGARs 8/9, intact placenta.  PROCEDURE IN DETAIL: The patient was taken to the operating room where anesthesia was administered and normal fetal heart tones were confirmed. She was then prepped and draped in the normal fashion in the dorsal supine position with a leftward tilt.  After a time out was performed, a pfannensteil skin incision was made with the scalpel and carried through to the underlying layer of fascia. The fascia was then incised at the midline and this incision was extended laterally bluntly. Attention was turned to the superior aspect of the fascial incision which was grasped manually, tented up and the rectus muscles were dissected off bluntly. In a similar fashion the inferior aspect of the fascial incision was grasped manually, tented up and the rectus muscles  dissected off bluntly. The rectus muscles were then separated in the midline and the peritoneum was entered bluntly. The bladder blade was inserted and the vesicouterine peritoneum was identified.  A low transverse hysterotomy was made with the scalpel until the endometrial cavity was breached and the amniotic sac ruptured with the Allis clamp, yielding clear amniotic fluid. This incision was extended bluntly and the infant's head, shoulders and body were delivered atraumatically.The cord was clamped x 2 and cut, and the infant was handed to the awaiting pediatricians, after delayed cord clamping was done.  The placenta was then gradually expressed from the uterus and then the uterus was exteriorized and cleared of all clots and debris. The hysterotomy was repaired with a running suture of 0 Monocryl. A single interrupted stitch of 1-0 Monocryl suture was then placed on the superior aspect of the hysterotomy on the right to achieve excellent hemostasis.   Left Fimbriectomy: The left Fallopian tube was identified, grasped manually. A large varicosity was noted in the left mesosalpinx, so it was decided to do a left fimbriectomy instead of a left salpingectomy. A Kelly was placed on the mesosalpinx, away from the varicosity, and the fimbrie was transected with Metzenbaum scissors and removed.  Using 2-0 Chromic, the portion of the tube and mesosalpinx was ligated, with excellent hemostasis noted at the end of the procedure.  The uterus and adnexa were then returned to the abdomen, and the hysterotomy and all operative sites were reinspected and excellent hemostasis was noted after irrigation and suction of the abdomen with warm saline.  The rectus muscles were reapproximated using a single interrupted stitch of 0 Chromic. The fascia was reapproximated with 0 Vicryl in  a simple running fashion bilaterally. The subcutaneous layer was then reapproximated with a single interrupted suture of 2-0 Chromic, and the  skin was then closed with 4-0 Vicryl, in a subcuticular fashion.  The patient  tolerated the procedure well. Sponge, lap, needle, and instrument counts were correct x 2. The patient was transferred to the recovery room awake, alert and breathing independently in stable condition.  Merilyn Baba, DO [facility] [employer])

## 2020-06-06 NOTE — Anesthesia Preprocedure Evaluation (Signed)
Anesthesia Evaluation  Patient identified by MRN, date of birth, ID band Patient awake    Reviewed: Allergy & Precautions, H&P , NPO status , Patient's Chart, lab work & pertinent test results  Airway Mallampati: II  TM Distance: >3 FB Neck ROM: full    Dental no notable dental hx.    Pulmonary neg pulmonary ROS, former smoker,    Pulmonary exam normal breath sounds clear to auscultation       Cardiovascular negative cardio ROS Normal cardiovascular exam Rhythm:regular Rate:Normal     Neuro/Psych Depression negative neurological ROS  negative psych ROS   GI/Hepatic negative GI ROS, Neg liver ROS,   Endo/Other  negative endocrine ROS  Renal/GU negative Renal ROS  negative genitourinary   Musculoskeletal negative musculoskeletal ROS (+)   Abdominal (+) + obese,   Peds negative pediatric ROS (+)  Hematology negative hematology ROS (+)   Anesthesia Other Findings   Reproductive/Obstetrics negative OB ROS (+) Pregnancy                             Anesthesia Physical  Anesthesia Plan  ASA: II  Anesthesia Plan: Spinal   Post-op Pain Management:    Induction:   PONV Risk Score and Plan: 2 and Ondansetron, Midazolam and Treatment may vary due to age or medical condition  Airway Management Planned: Natural Airway  Additional Equipment:   Intra-op Plan:   Post-operative Plan:   Informed Consent: I have reviewed the patients History and Physical, chart, labs and discussed the procedure including the risks, benefits and alternatives for the proposed anesthesia with the patient or authorized representative who has indicated his/her understanding and acceptance.       Plan Discussed with:   Anesthesia Plan Comments:         Anesthesia Quick Evaluation

## 2020-06-07 LAB — CBC
HCT: 25.2 % — ABNORMAL LOW (ref 36.0–46.0)
Hemoglobin: 7.8 g/dL — ABNORMAL LOW (ref 12.0–15.0)
MCH: 25.2 pg — ABNORMAL LOW (ref 26.0–34.0)
MCHC: 31 g/dL (ref 30.0–36.0)
MCV: 81.3 fL (ref 80.0–100.0)
Platelets: 229 10*3/uL (ref 150–400)
RBC: 3.1 MIL/uL — ABNORMAL LOW (ref 3.87–5.11)
RDW: 15.8 % — ABNORMAL HIGH (ref 11.5–15.5)
WBC: 14.4 10*3/uL — ABNORMAL HIGH (ref 4.0–10.5)
nRBC: 0 % (ref 0.0–0.2)

## 2020-06-07 MED ORDER — SERTRALINE HCL 25 MG PO TABS
25.0000 mg | ORAL_TABLET | Freq: Every day | ORAL | Status: DC
  Administered 2020-06-07 – 2020-06-08 (×2): 25 mg via ORAL
  Filled 2020-06-07 (×2): qty 1

## 2020-06-07 MED ORDER — PROPOFOL 10 MG/ML IV BOLUS
INTRAVENOUS | Status: AC
  Filled 2020-06-07: qty 20

## 2020-06-07 MED ORDER — SODIUM CHLORIDE 0.9 % IV SOLN
510.0000 mg | Freq: Once | INTRAVENOUS | Status: AC
  Administered 2020-06-07: 510 mg via INTRAVENOUS
  Filled 2020-06-07: qty 17

## 2020-06-07 NOTE — Progress Notes (Addendum)
Subjective: Postpartum Day 1: Cesarean Delivery Patient reports feeling well. She has not yet urinated but has ambulated. Pain is well-controlled. Minimal lochia. Has not yet passed flatus.  Objective: Vital signs in last 24 hours: Temp:  [97.7 F (36.5 C)-98.4 F (36.9 C)] 97.8 F (36.6 C) (06/12 2348) Pulse Rate:  [60-84] 60 (06/13 0352) Resp:  [15-21] 18 (06/13 0352) BP: (89-188)/(52-158) 108/54 (06/13 0352) SpO2:  [95 %-100 %] 100 % (06/13 0352)  Physical Exam:  General: alert, cooperative and no distress Lochia: appropriate Uterine Fundus: firm Incision: healing well, no significant drainage DVT Evaluation: No evidence of DVT seen on physical exam.  Recent Labs    06/04/20 0914 06/07/20 0725  HGB 9.6* 7.8*  HCT 31.6* 25.2*    Assessment/Plan: Status post Cesarean section. Doing well postoperatively.  Continue current care. Will give IV iron Vitals stable S/p BTL Breastfeeding Declines circumcision  [name] 06/07/2020, 7:46 AM

## 2020-06-07 NOTE — Clinical Social Work Maternal (Signed)
CLINICAL SOCIAL WORK MATERNAL/CHILD NOTE  Patient Details  Name: [name] MRN: [id number] Date of Birth: 1991-05-07  Date:  06/07/2020  Clinical Social Worker Initiating Note:  Georgeanne Nim, MSW, LCSWA Date/Time: Initiated:  06/07/20/1300     Child's Name:  [name]   Biological Parents:  Mother, Father ([name], [name], 05/16/1988)   Need for Interpreter:  None   Reason for Referral:  Behavioral Health Concerns, Current Substance Use/Substance Use During Pregnancy    Address:  [place] [address]    Phone number:  [phone] (home)     Additional phone number: none stated  Household Members/Support Persons (HM/SP):   Household Member/Support Person 1, Household Member/Support Person 2, Household Member/Support Person 3, Household Member/Support Person 4, Household Member/Support Person 5   HM/SP Name Relationship DOB or Age  HM/SP -Energy  HM/SP -2 [name] 06/22/2008  HM/SP -3 [name] 05/24/2010  HM/SP -4 [name] 01/01/2015  HM/SP -5 [name] [birth year] (23/24)  HM/SP -6        HM/SP -7        HM/SP -8          Natural Supports (not living in the home):      Professional Supports:     Employment: Unemployed   Type of Work:     Education:  9 to 11 years   Homebound arranged: No (n/a)  Financial Resources:  Medicaid   Other Resources:  Physicist, medical , [place] Considerations Which May Impact Care:  none stated  Strengths:  Ability to meet basic needs , Psychotropic Medications, Pediatrician chosen, Home prepared for child , Understanding of illness   Psychotropic Medications:  Zoloft      Pediatrician:    [employer]  Pediatrician List:   [place]      Pediatrician Fax Number:    Risk Factors/Current Problems:   Substance Use    Cognitive State:  Alert , Able to Concentrate    Mood/Affect:  Interested , Comfortable , Calm , Happy , Relaxed    CSW Assessment: CSW received consult for hx of THC use, postpartum depression and anxiety, and depression.  CSW met with [name] at bedside to offer support and complete assessment. On arrival CSW introduce self and stated purpose of visit. [name] and infant [name] were present, however, after PPD/A and SIDS education, [name] stepped out of room to offer [name] privacy during assessment.   CSW provided education regarding the baby blues period vs. perinatal mood disorders, discussed treatment and gave resources for mental health follow up if concerns arise.  CSW recommends self-evaluation during the postpartum time period using the New Mom Checklist from Postpartum Progress and encouraged [name] and [name] to contact a medical professional if symptoms are noted at any time. Both agreed and denied any questions. [name] reported history of postpartum depression and anxiety after birth of oldest two children. [name] reported sx as crying daily and isolations. [name] reported sx last for first year each time. [name] stated she did not talk to anyone about sx and used strategies such as playing with children and positive self talk as means to get by. CSW normalized thought process, offered encouragement and education to address stigma and shame. [name] and [name] were receptive and appreciative.  [name] stated she is does not feel embarrassed anymore and feels comfortable talking ab out feelings with [name] and medical providers.    CSW provided review of Sudden Infant Death Syndrome (SIDS) precautions. [name] and [name] stated understanding and denied any questions. [name] confirmed having all needed items for baby including car seat and bassinet for baby's safe sleep.   During assessment, [name] reported history of depression and anxiety since age of 29 y/o. [name] reported when she was 29 y/o she self-harmed via cutting and  attempted suicide. [name] reported she was also self medicating by using cocaine. However, she was sent to a group home and received support and therapy. [name] stated she has not used cocaine, self harmed, or had SI or attempts since that year. [name] reports she continues to deal with anxiety, however, recently she was prescribed Zoloft and plans to start actively taking now that she has delivered. CSW agreed to update RN for updated script if needed. [name] was appreciative. [name] denied any other dx or hx. [name] denied any SI, HI, or domestic violence. [name] identified [name] and children as support. [name] stated talking with her husband helps and she is considering therapy. CSW offered [name] a Futures trader. [name] was appreciative.   CSW informed [name] of hospital's infant drug screen policy, infant's negative UDS results, pending CDS results, and CPS report if warranted. [name] stated understanding and denied any questions. CSW assessed for previous CPS hx. [name] reported a closed case 10 years ago when oldest Ortiz was baby. [name] reported case was for child endangerment. [name] stated Ortiz was around guns and it was reported. [name] stated case was closed and all children remain in her custody. [name] stated case was in [place], [place]. [name] denies any other CPS history.  CSW thanked [name] for honestly.   After visit, CSW contacted [facility] CPS to review information provided by [name]. CSW spoke with CPS Investigator Darion [phone]). Aileen Fass stated he would record report and review with supervisor, however, he believed report would be screened out due to case being over 10 years ago and infant's UDS is negative. DGilford Rile recommended CSW call back if CDS results are positive.  CSW statd understanding and thanked him for his time. CSW will update weekday CSW. CSW identifies no further need for intervention and no barriers to discharge at this time.  CSW Plan/Description:  No Further Intervention Required/No Barriers to Discharge, Sudden  Infant Death Syndrome (SIDS) Education, Perinatal Mood and Anxiety Disorder (PMADs) Education, CSW Will Continue to Monitor Umbilical Cord Tissue Drug Screen Results and Make Report if [facility], Rafael Gonzalez D. Lissa Morales, MSW, [facility] Clinical Social Worker [phone] 06/07/2020, 4:42 PM

## 2020-06-08 MED ORDER — SERTRALINE HCL 25 MG PO TABS: 25.0000 mg | ORAL_TABLET | Freq: Every day | ORAL | 0 refills | Status: DC

## 2020-06-08 MED ORDER — IBUPROFEN 800 MG PO TABS: 800.0000 mg | ORAL_TABLET | Freq: Four times a day (QID) | ORAL | 0 refills | Status: DC

## 2020-06-08 MED ORDER — FERROUS SULFATE 325 (65 FE) MG PO TABS: 325.0000 mg | ORAL_TABLET | Freq: Every day | ORAL | 0 refills | Status: DC

## 2020-06-08 MED ORDER — ACETAMINOPHEN 500 MG PO TABS: 500.0000 mg | ORAL_TABLET | Freq: Four times a day (QID) | ORAL | 0 refills | Status: DC | PRN

## 2020-06-08 MED ORDER — SENNOSIDES-DOCUSATE SODIUM 8.6-50 MG PO TABS: 2.0000 | ORAL_TABLET | ORAL | 0 refills | Status: DC

## 2020-06-08 MED ORDER — OXYCODONE HCL 5 MG PO TABS: 5.0000 mg | ORAL_TABLET | ORAL | 0 refills | Status: DC | PRN

## 2020-06-08 MED FILL — SENEXON-S 8.6-50 MG TABS: 8.6-50 | 15 days supply | Qty: 30 | Fill #0

## 2020-06-08 MED FILL — ACETAMINOPHEN 500MG XT STRE: 500 | 8 days supply | Qty: 30 | Fill #0

## 2020-06-08 MED FILL — IBUPROFEN 800 MG TAB: 800 | 8 days supply | Qty: 30 | Fill #0

## 2020-06-08 MED FILL — SERTRALINE HCL 25 MG TABS: 25 | 30 days supply | Qty: 30 | Fill #0

## 2020-06-08 MED FILL — FERROUS SULFATE 325 MG TAB: 325 (65 FE) | 30 days supply | Qty: 30 | Fill #0

## 2020-06-08 MED FILL — oxyCODONE HCL 5 MG TABS: 5 | 4 days supply | Qty: 26 | Fill #0

## 2020-06-08 NOTE — Lactation Note (Signed)
This note was copied from a baby's chart. Lactation Consultation Note Baby is 64 hrs old. Baby is cluster feeding.  Mom's breast are filling. On the very of engorgement. Breast are getting firm. Encouraged mom to post pump after breast feeding and massage breast to get milk out. Reviewed engorgement and management. Praised mom for such a good job BF baby. Mom had BF and milk supply issues w/her other children. Mom stated her milk never came in like this. Mom seemed happy.  LC offered to send [facility] referral for mom. Mom thanked LC.   Patient Name: [name] [name] Date: 06/08/2020 Reason for consult: Follow-up assessment;Term   Maternal Data    Feeding Feeding Type: Breast Fed  LATCH Score Latch: Grasps breast easily, tongue down, lips flanged, rhythmical sucking.  Audible Swallowing: Spontaneous and intermittent  Type of Nipple: Everted at rest and after stimulation  Comfort (Breast/Nipple): Filling, red/small blisters or bruises, mild/mod discomfort (breast filling)  Hold (Positioning): No assistance needed to correctly position infant at breast.  LATCH Score: 9  Interventions Interventions: Breast feeding basics reviewed;DEBP;Breast compression;Breast massage;Support pillows  Lactation Tools Discussed/Used Tools: Pump Breast pump type: Double-Electric Breast Pump   Consult Status Consult Status: Follow-up Date: 06/09/20 Follow-up type: In-patient    Charyl Dancer 06/08/2020, 3:48 AM

## 2020-06-08 NOTE — Lactation Note (Signed)
This note was copied from a baby's chart. Lactation Consultation Note  Patient Name: [name] [id number] Date: 06/08/2020 Reason for consult: Follow-up assessment   Visited with mother right before discharge; family packed and awaiting assistance walking out.  Mother had no questions/concerns related to breast feeding.  She stated that baby is still "clamping" down at times with feeding; discussed how to help with this.  Mother has been using coconut oil for relief.  Provided comfort gels with instructions for use.    Mother will continue to feed 8-12 times/24 hours or sooner if baby shows feeding cues.  She is familiar with engorgement prevention/treatment.  Mother has a manual pump and a DEBP for home use.  Father present.  Mother also has our OP phone number for concerns after discharge.   Maternal Data    Feeding Feeding Type: Breast Fed  LATCH Score Latch: Grasps breast easily, tongue down, lips flanged, rhythmical sucking.  Audible Swallowing: Spontaneous and intermittent  Type of Nipple: Everted at rest and after stimulation  Comfort (Breast/Nipple): Filling, red/small blisters or bruises, mild/mod discomfort  Hold (Positioning): No assistance needed to correctly position infant at breast.  LATCH Score: 9  Interventions    Lactation Tools Discussed/Used     Consult Status Consult Status: Complete Date: 06/08/20 Follow-up type: Call as needed    Katerin Negrete R Waleska Buttery 06/08/2020, 12:54 PM

## 2020-06-09 LAB — SURGICAL PATHOLOGY

## 2020-06-16 ENCOUNTER — Encounter: Payer: Medicaid Other | Admitting: Obstetrics & Gynecology

## 2020-07-15 ENCOUNTER — Other Ambulatory Visit: Payer: Self-pay

## 2020-07-15 ENCOUNTER — Encounter: Payer: Self-pay | Admitting: Obstetrics and Gynecology

## 2020-07-15 ENCOUNTER — Ambulatory Visit (INDEPENDENT_AMBULATORY_CARE_PROVIDER_SITE_OTHER): Payer: Medicaid Other | Admitting: Obstetrics and Gynecology

## 2020-07-15 DIAGNOSIS — T8149XA Infection following a procedure, other surgical site, initial encounter: Secondary | ICD-10-CM

## 2020-07-15 DIAGNOSIS — Z48816 Encounter for surgical aftercare following surgery on the genitourinary system: Secondary | ICD-10-CM

## 2020-07-15 NOTE — Progress Notes (Signed)
Patient ID: [name], female   DOB: 08-Sep-1991, 29 y.o.   MRN: [id number]     Subjective:  [name] is a 29 y.o. female now 5.5 weeks status post c section and left salpingectomy. She has some pain and redness around her insicion,   Review of Systems Negative except for pain and redness around incision.   Diet: negative   Bowel movements: normal.  Objective:  BP 99/69 (BP Location: Left Arm, Patient Position: Sitting, Cuff Size: Normal)   Pulse 60   Wt 149 lb (67.6 kg)   Breastfeeding Yes   BMI 30.09 kg/m  General:Well developed, well nourished.  No acute distress. Abdomen: Bowel sounds normal, soft, non-tender. Pelvic Exam: DEFERRED  Incision(s): One small stitch sticking through skin on left side of incision was removed and a 3 mm defect was treated with silver nitrate.  There was a 1 cm x 5 mm deep area of non-union on the right end of the incision. Bleeds easily. Not infected. Treated with silver nitrate after topical anaesthetic.    Assessment:  Post-Op 5.5 weeks s/p c section and left salpingectomy.  Doing well postoperatively.  Plan:  Wound care discussed., Topical Neosporin dressing to keep it stable  2.   Current medications. 3.   Activity restrictions: no overhead lifting and no repetitive motion 4.   Return to work: not applicable. 5.   Follow up in 3 weeks. For postpartum visit and incision check  By signing my name below, I, Pietro Cassis, attest that this documentation has been prepared under the direction and in the presence of Tilda Burrow, MD. Electronically Signed: Pietro Cassis, Medical Scribe. 07/15/20. 4:16 PM.  I personally performed the services described in this documentation, which was SCRIBED in my presence. The recorded information has been reviewed and considered accurate. It has been edited as necessary during review. Tilda Burrow, MD

## 2020-08-05 ENCOUNTER — Encounter: Payer: Self-pay | Admitting: Advanced Practice Midwife

## 2020-08-05 ENCOUNTER — Ambulatory Visit (INDEPENDENT_AMBULATORY_CARE_PROVIDER_SITE_OTHER): Payer: Medicaid Other | Admitting: Advanced Practice Midwife

## 2020-08-05 DIAGNOSIS — F53 Postpartum depression: Secondary | ICD-10-CM

## 2020-08-05 MED ORDER — SERTRALINE HCL 25 MG PO TABS: 25.0000 mg | ORAL_TABLET | Freq: Every day | ORAL | 6 refills | Status: DC

## 2020-08-05 NOTE — Progress Notes (Signed)
POSTPARTUM VISIT Patient name: [name] MRN [id number]  Date of birth: 1991/07/22 Chief Complaint:   Postpartum Care (need talk about zoloft)  History of Present Illness:   [name] is a 29 y.o. [phone] Hispanic female being seen today for a postpartum visit. She is 8 weeks postpartum following a repeat cesarean section, low transverse incision and L fimbriectomy at 39 gestational weeks. Anesthesia: spinal.  I have fully reviewed the prenatal and intrapartum course. Pregnancy uncomplicated. Postpartum course has been complicated by inflammation at incision site requiring silver nitrate at 5wks postop. Bleeding no bleeding. Bowel function is normal. Bladder function is normal.  Patient is sexually active. Last sexual activity: didn't ask.    No LMP recorded. (Menstrual status: Lactating).  Baby's course has been uncomplicated. Baby is feeding by breast & bottle    Edinburgh Postpartum Depression Screening:   Edinburgh Postnatal Depression Scale - 08/05/20 1506      Edinburgh Postnatal Depression Scale:  In the Past 7 Days   I have been able to laugh and see the funny side of things. 0    I have looked forward with enjoyment to things. 0    I have blamed myself unnecessarily when things went wrong. 2    I have been anxious or worried for no good reason. 2    I have felt scared or panicky for no good reason. 1    Things have been getting on top of me. 1    I have been so unhappy that I have had difficulty sleeping. 0    I have felt sad or miserable. 0    I have been so unhappy that I have been crying. 0    The thought of harming myself has occurred to me. 0    Edinburgh Postnatal Depression Scale Total 6          Review of Systems:   Pertinent items are noted in HPI Denies Abnormal vaginal discharge w/ itching/odor/irritation, headaches, visual changes, shortness of breath, chest pain, abdominal pain, severe nausea/vomiting, or problems with urination or bowel  movements. Pertinent History Reviewed:  Reviewed past medical,surgical, obstetrical and family history.  Reviewed problem list, medications and allergies. OB History  Gravida Para Term Preterm AB Living  '5 4 4   1 4  ' SAB TAB Ectopic Multiple Live Births  0   1 0 4    # Outcome Date GA Lbr Len/2nd Weight Sex Delivery Anes PTL Lv  5 Term 06/06/20 [redacted]w[redacted]d 9 lb 2 oz (4.14 kg) M CS-LTranv Spinal  LIV  4 Ectopic 08/04/18          3 Term 01/01/15 337w2d0:28 / 02:51 9 lb 4.7 oz (4.216 kg) M CS-LTranv EPI N LIV     Complications: Failure to Progress in Second Stage  2 Term 05/24/10 3988w0d lb 5 oz (3.317 kg) F VBAC EPI  LIV  1 Term 06/22/08 40w1w0dlb 7 oz (3.827 kg) M CS-Unspec EPI  LIV     Birth Comments: failed forceps   Physical Assessment:   Vitals:   08/05/20 1505  BP: 101/67  Pulse: (!) 58  Weight: 151 lb (68.5 kg)  Height: 5' (1.524 m)  Body mass index is 29.49 kg/m.       Physical Examination:   General appearance: alert, well appearing, and in no distress  Mental status: alert, oriented to person, place, and time  Skin: warm & dry   Cardiovascular: normal heart rate  noted   Respiratory: normal respiratory effort, no distress   Breasts: deferred, no complaints   Abdomen: soft, non-tender; incision healing well, no erythema or drainage, well-approximated  Pelvic: normal external genitalia, vulva, vagina, cervix, uterus and adnexa  Rectal: no hemorrhoids  Extremities: no edema       No results found for this or any previous visit (from the past 24 hour(s)).  Assessment & Plan:  1) Postpartum exam 2) 8.5 wks s/p rLTCS 3) Breast and bottlefeeding 4) Depression screening- neg screen (6), but desires to start Zoloft since has a hx of PPD 5) Contraception counseling- s/p prev R salpingectomy, and L fimbriectomy  Essential components of care per ACOG recommendations:  1.  Mood and well being:  . Patient with negative depression screening today. If positive, discussed  and offered pt counseling +/- meds, pt would like to start Zoloft due to hx of PPD.  Reviewed local resources for support.  . Pre-existing mental health disorders? No  . Patient does not use tobacco. If using tobacco we discussed reduction/cessation and risk of relapse . Substance use disorder? No    2. Infant care and feeding:  . Patient currently breastfeeding? Yes If yes, discussed returning to work, pumping, breastfeeding-associated pain, guidance regarding return to fertility while lactating if not using another method. If needed, patient was provided with a letter to be allowed to pump q 2-3hrs to support lactation in a private location with access to a refrigerator to store breastmilk.   Candace Gallus has a pediatrician/family doctor? Yes  . Recommended that all caregivers be immunized for flu, pertussis and other preventable communicable diseases . Pt does have material needs met such as stable housing, utilities, food and diapers. If not, referred to local resources for help obtaining these.  3. Sexuality, contraception and birth spacing . Provided guidance regarding sexuality, management of dyspareunia, and resumption of intercourse . Patient does not want a pregnancy in the future.  Desired family size is 4 children.  . Discussed avoiding interpregnancy interval <43mhs and recommended birth spacing of 18 months . Reviewed forms of contraception. Patient desires tubal ligation.    4. Sleep and fatigue . Discussed coping options for fatigue and sleep disruption . Encouraged family/partner/community support of 4 hrs of uninterrupted sleep to help with mood and fatigue  5. Physical recovery  . Pt did  have a cesarean section. If yes, assessed incisional pain and providing guidance on normal vs prolonged recovery . Patient had a n/a laceration, perineal healing and pain reviewed.  . Patient has urinary incontinence? No, fecal incontinence? No If yes, discussed management and not referred to  PT or uro/gyn as indicated  . Patient is safe to resume physical activity. Discussed attainment of healthy weight.  6.  Chronic disease management . Discussed pregnancy complications if any, and their implications for future childbearing and long-term maternal health. . Review recommendations for prevention of recurrent pregnancy complications, such as 17 hydroxyprogesterone caproate to reduce risk for recurrent PTB not applicable, or aspirin to reduce risk of preeclampsia not applicable. . Pt had GDM: No.  . Reviewed medications and non-pregnant dosing including consideration of whether pt is breastfeeding using a reliable resource such as LactMed: no . Referred for f/u w/ PCP or subspecialist providers as indicated: not applicable  7. Health maintenance . Last pap smear Dec 2020 and results were normal . Mammogram at 29yo or earlier if indicated  Meds:  Meds ordered this encounter  Medications  . sertraline (ZOLOFT) 25  MG tablet    Sig: Take 1 tablet (25 mg total) by mouth daily.    Dispense:  90 tablet    Refill:  6    Order Specific Question:   Supervising Provider    Answer:   Jonnie Kind [[id number]]    Follow-up: Return in about 1 year (around 08/05/2021) for Physical.   No orders of the defined types were placed in this encounter.   Myrtis Ser CNM 08/05/2020 3:42 PM

## 2020-10-28 ENCOUNTER — Other Ambulatory Visit: Payer: Self-pay | Admitting: *Deleted

## 2020-10-28 NOTE — Patient Outreach (Signed)
Care Coordination  10/28/2020  [name] [birth year] [id number]   An unsuccessful telephone outreach was attempted today. The patient was referred to the case management team for assistance with care management and care coordination.   Follow Up Plan: A HIPAA compliant phone message was left for the patient providing contact information and requesting a return call.  The Managed Medicaid care management team will reach out to the patient again over the next 7-14 days.   Estanislado Emms RN, BSN [place]  [facility]

## 2020-10-28 NOTE — Patient Instructions (Signed)
Visit Information ° °Ms. [name]  - as a part of your Medicaid benefit, you are eligible for care management and care coordination services at no cost or copay. I was unable to reach you by phone today but would be happy to help you with your health related needs. Please feel free to call me @ [phone].  ° °A member of the Managed Medicaid care management team will reach out to you again over the next 7-14 days.  ° °Gareld Obrecht RN, BSN °[place]   [facility] °RN Care Coordinator ° ° °

## 2020-11-10 ENCOUNTER — Other Ambulatory Visit: Payer: Self-pay | Admitting: *Deleted

## 2020-11-10 ENCOUNTER — Other Ambulatory Visit: Payer: Self-pay

## 2020-11-10 NOTE — Patient Outreach (Signed)
Care Coordination  11/10/2020  [name] 1991/07/16 [id number]   The High Risk Managed Medicaid Care Team has reached out to [name]. She was unavailable during the appointment time today and requested a call back after 12. An appointment has been made for 11/17/20 @ 1pm.  Estanislado Emms RN, BSN [place]  [facility] RN Care Coordinator

## 2020-11-17 ENCOUNTER — Other Ambulatory Visit: Payer: Self-pay | Admitting: *Deleted

## 2020-11-17 NOTE — Patient Outreach (Signed)
Care Coordination  11/17/2020  [name] 07-Jun-1991 [id number]   A second unsuccessful telephone outreach was attempted today. The patient was referred to the case management team for assistance with care management and care coordination.   Follow Up Plan: A HIPAA compliant phone message was left for the patient providing contact information and requesting a return call.  The Managed Medicaid care management team will reach out to the patient again over the next 7-14 days.   Estanislado Emms RN, BSN [place]  [facility]

## 2020-11-17 NOTE — Patient Instructions (Signed)
Visit Information  Ms. [name]  - as a part of your Medicaid benefit, you are eligible for care management and care coordination services at no cost or copay. I was unable to reach you by phone today but would be happy to help you with your health related needs. Please feel free to call me @ [phone].   A member of the Managed Medicaid care management team will reach out to you again over the next 7-14 days.   Estanislado Emms RN, BSN [place]   [facility]

## 2020-11-24 ENCOUNTER — Other Ambulatory Visit: Payer: Self-pay

## 2020-11-24 ENCOUNTER — Other Ambulatory Visit: Payer: Medicaid Other

## 2020-11-24 DIAGNOSIS — Z20822 Contact with and (suspected) exposure to covid-19: Secondary | ICD-10-CM

## 2020-11-25 LAB — NOVEL CORONAVIRUS, NAA: SARS-CoV-2, NAA: NOT DETECTED

## 2020-11-25 LAB — SARS-COV-2, NAA 2 DAY TAT

## 2020-11-25 LAB — SPECIMEN STATUS REPORT

## 2020-11-26 ENCOUNTER — Telehealth: Payer: Self-pay | Admitting: General Practice

## 2020-11-26 NOTE — Telephone Encounter (Signed)
Pt is aware covid 19 test is neg on 01-17-2020 

## 2020-11-27 ENCOUNTER — Other Ambulatory Visit: Payer: Self-pay | Admitting: *Deleted

## 2020-11-27 NOTE — Patient Outreach (Signed)
Care Coordination  11/27/2020  [name] 09/04/1991 [id number]    Medicaid Managed Care   Unsuccessful Outreach Note  11/27/2020 Name: [name] MRN: [id number] DOB: [birth year]  Referred by: [facility] Reason for referral : High Risk Managed Medicaid (Unsuccessful initial outreach x 3)   Third unsuccessful telephone outreach was attempted today. The patient was referred to the case management team for assistance with care management and care coordination. The patient's primary care provider has been notified of our unsuccessful attempts to make or maintain contact with the patient. The care management team is pleased to engage with this patient at any time in the future should he/she be interested in assistance from the care management team.   Follow Up Plan: The patient has been provided with contact information for the Managed Medicaid care management team and has been advised to call with any health related questions or concerns.   Estanislado Emms RN, BSN [place]  [facility]

## 2022-12-05 ENCOUNTER — Ambulatory Visit
Admission: EM | Admit: 2022-12-05 | Discharge: 2022-12-05 | Disposition: A | Discharge status: Home / Self Care | Payer: Medicaid Other | Attending: Nurse Practitioner | Admitting: Nurse Practitioner

## 2022-12-05 DIAGNOSIS — Z1152 Encounter for screening for COVID-19: Secondary | ICD-10-CM | POA: Diagnosis not present

## 2022-12-05 DIAGNOSIS — J069 Acute upper respiratory infection, unspecified: Secondary | ICD-10-CM | POA: Diagnosis not present

## 2022-12-05 LAB — RESP PANEL BY RT-PCR (FLU A&B, COVID) ARPGX2
Influenza A by PCR: NEGATIVE
Influenza B by PCR: POSITIVE — AB
SARS Coronavirus 2 by RT PCR: NEGATIVE

## 2022-12-05 MED ORDER — BENZONATATE 100 MG PO CAPS: 100.0000 mg | ORAL_CAPSULE | Freq: Three times a day (TID) | ORAL | 0 refills | Status: AC | PRN

## 2022-12-05 MED ORDER — PROMETHAZINE-DM 6.25-15 MG/5ML PO SYRP: 5.0000 mL | ORAL_SOLUTION | Freq: Every evening | ORAL | 0 refills | Status: AC | PRN

## 2022-12-05 NOTE — Discharge Instructions (Addendum)
You have a viral upper respiratory infection.  Symptoms should improve over the next week to 10 days.  If you develop chest pain or shortness of breath, go to the emergency room.  We have tested you today for COVID-19 and influenza.  You will see the results in Mychart and we will call you with positive results.    Please stay home and isolate until you are aware of the results.    Some things that can make you feel better are: - Increased rest - Increasing fluid with water/sugar free electrolytes - Acetaminophen and ibuprofen as needed for fever/pain - Salt water gargling, chloraseptic spray and throat lozenges for sore throat - OTC guaifenesin (Mucinex) 600 mg twice daily for congestion - Saline sinus flushes or a neti pot - Humidifying the air -Tessalon Perles during the day as needed for dry cough and cough syrup at nighttime as needed for dry cough

## 2022-12-05 NOTE — ED Provider Notes (Signed)
[facility]    CSN: [id number] Arrival date & time: 12/05/22  1301      History   Chief Complaint Chief Complaint  Patient presents with   Cough   Fever   Sore Throat    HPI [name] is a 31 y.o. female.   Patient presents for 3 days of body aches, chills, low-grade fevers, dry/hacking cough, chest pain after coughing, chest and nasal congestion, sore throat, sinus pressure and headache, left ear pain, decreased appetite, loss of taste, and fatigue.  She denies shortness of breath or wheezing, abdominal pain, nausea/vomiting, diarrhea.  Has been taking DayQuil, Robitussin, and using warm p.o. which all do help temporarily.  Reports her children have also been sick recently similar symptoms.  Patient denies history of chronic lung disease.  No antibiotic use in the past 90 days.    Past Medical History:  Diagnosis Date   Medical history non-contributory     Patient Active Problem List   Diagnosis Date Noted   Depression 04/14/2020    Past Surgical History:  Procedure Laterality Date   CESAREAN SECTION  06/22/2008   CESAREAN SECTION N/A 01/01/2015   Procedure: CESAREAN SECTION;  Surgeon: Guss Bunde, MD;  Location: [facility];  Service: Obstetrics;  Laterality: N/A;   CESAREAN SECTION N/A 06/06/2020   Procedure: CESAREAN SECTION ( WITH LEFT SALPINGECTOMY);  Surgeon: Florian Buff, MD;  Location: [facility];  Service: Obstetrics;  Laterality: N/A;   LAPAROSCOPIC UNILATERAL SALPINGECTOMY  08/04/2018   Procedure: LAPAROSCOPIC RIGHT SALPINGECTOMY WITH REMOVAL OF ECTOPIC PREGNANCY;  Surgeon: Jonnie Kind, MD;  Location: [facility];  Service: Gynecology;;    OB History     Gravida  5   Para  4   Term  4   Preterm      AB  1   Living  4      SAB  0   IAB      Ectopic  1   Multiple  0   Live Births  4            Home Medications    Prior to Admission medications   Medication Sig Start Date End Date Taking? Authorizing Provider   benzonatate (TESSALON) 100 MG capsule Take 1 capsule (100 mg total) by mouth 3 (three) times daily as needed for cough. Do not take with alcohol or while driving or operating heavy machinery.  May cause drowsiness. 12/05/22  Yes Eulogio Bear, NP  promethazine-dextromethorphan (PROMETHAZINE-DM) 6.25-15 MG/5ML syrup Take 5 mLs by mouth at bedtime as needed for cough. Do not take with alcohol or while driving or operating heavy machinery.  May cause drowsiness. 12/05/22  Yes Eulogio Bear, NP    Family History Family History  Problem Relation Age of Onset   Diabetes Maternal Grandmother    Hypertension Maternal Grandmother    Stroke Maternal Grandmother    Miscarriages / [place] Mother     Social History Social History   Tobacco Use   Smoking status: Former    Types: Cigarettes   Smokeless tobacco: Never  Vaping Use   Vaping Use: Never used  Substance Use Topics   Alcohol use: No   Drug use: No     Allergies   Patient has no known allergies.   Review of Systems Review of Systems Per HPI  Physical Exam Triage Vital Signs ED Triage Vitals  Enc Vitals Group     BP 12/05/22 1428 107/71  Pulse Rate 12/05/22 1428 70     Resp 12/05/22 1428 16     Temp 12/05/22 1428 98.9 F (37.2 C)     Temp Source 12/05/22 1428 Temporal     SpO2 12/05/22 1428 98 %     Weight --      Height --      Head Circumference --      Peak Flow --      Pain Score 12/05/22 1439 6     Pain Loc --      Pain Edu? --      Excl. in [place]? --    No data found.  Updated Vital Signs BP 107/71 (BP Location: Right Arm)   Pulse 70   Temp 98.9 F (37.2 C) (Temporal)   Resp 16   LMP 10/05/2022 (Approximate) Comment: tubal ligation  SpO2 98%   Breastfeeding No   Visual Acuity Right Eye Distance:   Left Eye Distance:   Bilateral Distance:    Right Eye Near:   Left Eye Near:    Bilateral Near:     Physical Exam Vitals and nursing note reviewed.  Constitutional:       General: She is not in acute distress.    Appearance: Normal appearance. She is not ill-appearing or toxic-appearing.  HENT:     Head: Normocephalic and atraumatic.     Right Ear: Ear canal and external ear normal. No drainage, swelling or tenderness. A middle ear effusion is present. Tympanic membrane is not erythematous.     Left Ear: Ear canal and external ear normal. No drainage, swelling or tenderness. A middle ear effusion is present. Tympanic membrane is not erythematous.     Nose: Congestion and rhinorrhea present.     Mouth/Throat:     Mouth: Mucous membranes are moist.     Pharynx: Oropharynx is clear. No oropharyngeal exudate or posterior oropharyngeal erythema.  Eyes:     General: No scleral icterus.    Extraocular Movements: Extraocular movements intact.  Cardiovascular:     Rate and Rhythm: Normal rate and regular rhythm.  Pulmonary:     Effort: Pulmonary effort is normal. No respiratory distress.     Breath sounds: Normal breath sounds. No wheezing, rhonchi or rales.  Abdominal:     General: Abdomen is flat. Bowel sounds are normal. There is no distension.     Palpations: Abdomen is soft.  Musculoskeletal:     Cervical back: Normal range of motion and neck supple.  Lymphadenopathy:     Cervical: No cervical adenopathy.  Skin:    General: Skin is warm and dry.     Coloration: Skin is not jaundiced or pale.     Findings: No erythema or rash.  Neurological:     Mental Status: She is alert and oriented to person, place, and time.     Motor: No weakness.  Psychiatric:        Behavior: Behavior is cooperative.      UC Treatments / Results  Labs (all labs ordered are listed, but only abnormal results are displayed) Labs Reviewed  RESP PANEL BY RT-PCR (FLU A&B, COVID) [id number]    EKG   Radiology No results found.  Procedures Procedures (including critical care time)  Medications Ordered in UC Medications - No data to display  Initial Impression /  Assessment and Plan / UC Course  I have reviewed the triage vital signs and the nursing notes.  Pertinent labs & imaging results that were available during  my care of the patient were reviewed by me and considered in my medical decision making (see chart for details).   Patient is well-appearing, normotensive, afebrile, not tachycardic, not tachypneic, oxygenating well on room air.    Viral URI with cough Encounter for screening for COVID-19 Suspect viral etiology COVID-19, influenza testing obtained Supportive care discussed with patient-start cough suppressants ER and return precautions discussed  The patient was given the opportunity to ask questions.  All questions answered to their satisfaction.  The patient is in agreement to this plan.    Final Clinical Impressions(s) / UC Diagnoses   Final diagnoses:  Viral URI with cough  Encounter for screening for COVID-19     Discharge Instructions      You have a viral upper respiratory infection.  Symptoms should improve over the next week to 10 days.  If you develop chest pain or shortness of breath, go to the emergency room.  We have tested you today for COVID-19 and influenza.  You will see the results in Mychart and we will call you with positive results.    Please stay home and isolate until you are aware of the results.    Some things that can make you feel better are: - Increased rest - Increasing fluid with water/sugar free electrolytes - Acetaminophen and ibuprofen as needed for fever/pain - Salt water gargling, chloraseptic spray and throat lozenges for sore throat - OTC guaifenesin (Mucinex) 600 mg twice daily for congestion - Saline sinus flushes or a neti pot - Humidifying the air -Tessalon Perles during the day as needed for dry cough and cough syrup at nighttime as needed for dry cough     ED Prescriptions     Medication Sig Dispense Auth. Provider   benzonatate (TESSALON) 100 MG capsule Take 1 capsule (100  mg total) by mouth 3 (three) times daily as needed for cough. Do not take with alcohol or while driving or operating heavy machinery.  May cause drowsiness. 21 capsule Cathlean Marseilles A, NP   promethazine-dextromethorphan (PROMETHAZINE-DM) 6.25-15 MG/5ML syrup Take 5 mLs by mouth at bedtime as needed for cough. Do not take with alcohol or while driving or operating heavy machinery.  May cause drowsiness. 118 mL Valentino Nose, NP      PDMP not reviewed this encounter.   Valentino Nose, NP 12/05/22 1515

## 2022-12-05 NOTE — ED Triage Notes (Signed)
Patient presents to [facility] for sore throat, fever, and cough since Friday. Treating symptoms with dayquil, nyquil, and tylenol. Last known fever yesterday.

## 2023-04-04 ENCOUNTER — Ambulatory Visit (INDEPENDENT_AMBULATORY_CARE_PROVIDER_SITE_OTHER): Payer: Medicaid Other | Admitting: *Deleted

## 2023-04-04 VITALS — BP 114/79 | HR 78

## 2023-04-04 DIAGNOSIS — N926 Irregular menstruation, unspecified: Secondary | ICD-10-CM | POA: Diagnosis not present

## 2023-04-04 LAB — POCT URINE PREGNANCY: Preg Test, Ur: NEGATIVE

## 2023-04-04 NOTE — Progress Notes (Signed)
   NURSE VISIT- PREGNANCY CONFIRMATION   SUBJECTIVE:  [name] is a 32 y.o. [id number] female at Unknown by uncertain LMP of No LMP recorded. Here for pregnancy confirmation.  Home pregnancy test: positive x 2   She reports no complaints.  She is not taking prenatal vitamins.    OBJECTIVE:  BP 114/79 (BP Location: Right Arm, Patient Position: Sitting, Cuff Size: Normal)   Pulse 78   Appears well, in no apparent distress  Results for orders placed or performed in visit on 04/04/23 (from the past 24 hour(s))  POCT urine pregnancy   Collection Time: 04/04/23  4:22 PM  Result Value Ref Range   Preg Test, Ur Negative Negative    ASSESSMENT: Negative pregnancy test    PLAN: Schedule for dating ultrasound in pending hcg results Prenatal vitamins:  pending hcg results    Nausea medicines: not currently needed   OB packet given: No  Pt sent for hcg level. Has hx of right salpingectomy and left fimbriectomy.  Annamarie Dawley  04/04/2023 4:23 PM

## 2023-04-05 LAB — BETA HCG QUANT (REF LAB): hCG Quant: <1 m[IU]/mL

## 2024-10-27 ENCOUNTER — Emergency Department (HOSPITAL_COMMUNITY)

## 2024-10-27 ENCOUNTER — Emergency Department (HOSPITAL_COMMUNITY)
Admission: EM | Admit: 2024-10-27 | Discharge: 2024-10-27 | Disposition: A | Discharge status: Home / Self Care | Attending: Emergency Medicine | Admitting: Emergency Medicine

## 2024-10-27 ENCOUNTER — Other Ambulatory Visit: Payer: Self-pay

## 2024-10-27 ENCOUNTER — Encounter (HOSPITAL_COMMUNITY): Payer: Self-pay | Admitting: Emergency Medicine

## 2024-10-27 DIAGNOSIS — R0789 Other chest pain: Secondary | ICD-10-CM | POA: Insufficient documentation

## 2024-10-27 DIAGNOSIS — R519 Headache, unspecified: Secondary | ICD-10-CM | POA: Diagnosis not present

## 2024-10-27 DIAGNOSIS — E876 Hypokalemia: Secondary | ICD-10-CM | POA: Diagnosis not present

## 2024-10-27 DIAGNOSIS — F1410 Cocaine abuse, uncomplicated: Secondary | ICD-10-CM | POA: Diagnosis not present

## 2024-10-27 DIAGNOSIS — R079 Chest pain, unspecified: Secondary | ICD-10-CM

## 2024-10-27 LAB — BASIC METABOLIC PANEL WITH GFR
Anion gap: 19 — ABNORMAL HIGH (ref 5–15)
BUN: 16 mg/dL (ref 6–20)
CO2: 19 mmol/L — ABNORMAL LOW (ref 22–32)
Calcium: 9.4 mg/dL (ref 8.9–10.3)
Chloride: 96 mmol/L — ABNORMAL LOW (ref 98–111)
Creatinine, Ser: 0.7 mg/dL (ref 0.44–1.00)
GFR, Estimated: >60 mL/min (ref >60)
Glucose, Bld: 203 mg/dL — ABNORMAL HIGH (ref 70–99)
Potassium: 2.8 mmol/L — ABNORMAL LOW (ref 3.5–5.1)
Sodium: 135 mmol/L (ref 135–145)

## 2024-10-27 LAB — CBC
HCT: 41.5 % (ref 36.0–46.0)
Hemoglobin: 14.5 g/dL (ref 12.0–15.0)
MCH: 30.7 pg (ref 26.0–34.0)
MCHC: 34.9 g/dL (ref 30.0–36.0)
MCV: 87.7 fL (ref 80.0–100.0)
Platelets: 368 K/uL (ref 150–400)
RBC: 4.73 MIL/uL (ref 3.87–5.11)
RDW: 12.3 % (ref 11.5–15.5)
WBC: 10 K/uL (ref 4.0–10.5)
nRBC: 0 % (ref 0.0–0.2)

## 2024-10-27 LAB — LACTIC ACID, PLASMA
Lactic Acid, Venous: 1.5 mmol/L (ref 0.5–1.9)
Lactic Acid, Venous: 1.2 mmol/L (ref 0.5–1.9)

## 2024-10-27 LAB — TROPONIN T, HIGH SENSITIVITY
Troponin T High Sensitivity: <15 ng/L (ref 0–19)
Troponin T High Sensitivity: <15 ng/L (ref 0–19)

## 2024-10-27 LAB — HCG, SERUM, QUALITATIVE: Preg, Serum: NEGATIVE

## 2024-10-27 LAB — SALICYLATE LEVEL: Salicylate Lvl: <7 mg/dL — ABNORMAL LOW (ref 7.0–30.0)

## 2024-10-27 LAB — CK: Total CK: 226 U/L (ref 38–234)

## 2024-10-27 LAB — MAGNESIUM: Magnesium: 1.9 mg/dL (ref 1.7–2.4)

## 2024-10-27 LAB — ACETAMINOPHEN LEVEL: Acetaminophen (Tylenol), Serum: <10 ug/mL — ABNORMAL LOW (ref 10–30)

## 2024-10-27 MED ORDER — POTASSIUM CHLORIDE 20 MEQ PO PACK
60.0000 meq | PACK | Freq: Once | ORAL | Status: AC
  Administered 2024-10-27: 60 meq via ORAL
  Filled 2024-10-27: qty 3

## 2024-10-27 MED ORDER — LORAZEPAM 1 MG PO TABS: 1.0000 mg | ORAL_TABLET | Freq: Once | ORAL | Status: DC

## 2024-10-27 MED ORDER — LACTATED RINGERS IV BOLUS
1000.0000 mL | Freq: Once | INTRAVENOUS | Status: AC
  Administered 2024-10-27: 1000 mL via INTRAVENOUS

## 2024-10-27 MED ORDER — ASPIRIN 81 MG PO CHEW
162.0000 mg | CHEWABLE_TABLET | Freq: Once | ORAL | Status: AC
  Administered 2024-10-27: 162 mg via ORAL
  Filled 2024-10-27: qty 2

## 2024-10-27 MED ORDER — POTASSIUM CHLORIDE CRYS ER 20 MEQ PO TBCR: 40.0000 meq | EXTENDED_RELEASE_TABLET | Freq: Once | ORAL | Status: DC

## 2024-10-27 MED ORDER — MAGNESIUM SULFATE 2 GM/50ML IV SOLN: 2.0000 g | Freq: Once | INTRAVENOUS | Status: DC

## 2024-10-27 MED ORDER — LORAZEPAM 2 MG/ML IJ SOLN
1.0000 mg | Freq: Once | INTRAMUSCULAR | Status: AC
  Administered 2024-10-27: 1 mg via INTRAVENOUS
  Filled 2024-10-27: qty 1

## 2024-10-27 NOTE — Discharge Instructions (Addendum)
 We saw you in the ER for the chest pain/shortness of breath. All of our cardiac workup is normal, including labs, EKG and chest X-RAY are normal. We are not sure what is causing your discomfort, but we feel comfortable sending you home at this time. The workup in the ER is not complete, and you should follow up with your primary care doctor for further evaluation.  Please return to the ER if you have worsening headache, chest pain, shortness of breath, pain radiating to your jaw, shoulder, or back, sweats or fainting.

## 2024-10-27 NOTE — ED Provider Notes (Signed)
 [place] EMERGENCY DEPARTMENT AT [facility] Provider Note   CSN: [id number] Arrival date & time: 10/27/24  9473     Patient presents with: Cocaine Overdose and Chest Pain   [name] is a 33 y.o. female.   Presents to the emergency department stating that she is experiencing a headache, sore throat and chest pain.  She is accompanied by her husband who reports that she has been using excessive amounts of cocaine recently, last used 3 hours ago.       Prior to Admission medications   Medication Sig Start Date End Date Taking? Authorizing Provider  benzonatate  (TESSALON ) 100 MG capsule Take 1 capsule (100 mg total) by mouth 3 (three) times daily as needed for cough. Do not take with alcohol or while driving or operating heavy machinery.  May cause drowsiness. 12/05/22   Chandra Harlene LABOR, NP  promethazine -dextromethorphan (PROMETHAZINE -DM) 6.25-15 MG/5ML syrup Take 5 mLs by mouth at bedtime as needed for cough. Do not take with alcohol or while driving or operating heavy machinery.  May cause drowsiness. 12/05/22   Chandra Harlene LABOR, NP    Allergies: Patient has no known allergies.    Review of Systems  Updated Vital Signs BP 128/78 (BP Location: Left Arm)   Pulse 97   Temp 98.1 F (36.7 C) (Oral)   Resp 18   Ht 5' (1.524 m)   Wt 69 kg   LMP 10/20/2024   SpO2 96%   BMI 29.71 kg/m   Physical Exam Vitals and nursing note reviewed.  Constitutional:      General: She is not in acute distress.    Appearance: She is well-developed.  HENT:     Head: Normocephalic and atraumatic.     Nose:     Right Nostril: No epistaxis or septal hematoma.     Left Nostril: No epistaxis or septal hematoma.     Mouth/Throat:     Mouth: Mucous membranes are moist.  Eyes:     General: Vision grossly intact. Gaze aligned appropriately.     Extraocular Movements: Extraocular movements intact.     Conjunctiva/sclera: Conjunctivae normal.  Cardiovascular:     Rate and  Rhythm: Normal rate and regular rhythm.     Pulses: Normal pulses.     Heart sounds: Normal heart sounds, S1 normal and S2 normal. No murmur heard.    No friction rub. No gallop.  Pulmonary:     Effort: Pulmonary effort is normal. No respiratory distress.     Breath sounds: Normal breath sounds.  Abdominal:     General: Bowel sounds are normal.     Palpations: Abdomen is soft.     Tenderness: There is no abdominal tenderness. There is no guarding or rebound.     Hernia: No hernia is present.  Musculoskeletal:        General: No swelling.     Cervical back: Full passive range of motion without pain, normal range of motion and neck supple. No spinous process tenderness or muscular tenderness. Normal range of motion.     Right lower leg: No edema.     Left lower leg: No edema.  Skin:    General: Skin is warm and dry.     Capillary Refill: Capillary refill takes less than 2 seconds.     Findings: No ecchymosis, erythema, rash or wound.  Neurological:     General: No focal deficit present.     Mental Status: She is alert and oriented to person,  place, and time.     GCS: GCS eye subscore is 4. GCS verbal subscore is 5. GCS motor subscore is 6.     Cranial Nerves: Cranial nerves 2-12 are intact.     Sensory: Sensation is intact.     Motor: Motor function is intact.     Coordination: Coordination is intact.  Psychiatric:        Attention and Perception: Attention normal.        Mood and Affect: Mood normal.        Speech: Speech normal.        Behavior: Behavior normal.     (all labs ordered are listed, but only abnormal results are displayed) Labs Reviewed  BASIC METABOLIC PANEL WITH GFR - Abnormal; Notable for the following components:      Result Value   Potassium 2.8 (*)    Chloride 96 (*)    CO2 19 (*)    Glucose, Bld 203 (*)    Anion gap 19 (*)    All other components within normal limits  CBC  HCG, SERUM, QUALITATIVE  LACTIC ACID, PLASMA  LACTIC ACID, PLASMA  CK   MAGNESIUM  URINE DRUG SCREEN  TROPONIN T, HIGH SENSITIVITY    EKG: EKG Interpretation Date/Time:  Sunday October 27 2024 05:51:35 EST Ventricular Rate:  84 PR Interval:  155 QRS Duration:  106 QT Interval:  386 QTC Calculation: 457 R Axis:   72  Text Interpretation: Sinus rhythm Borderline T wave abnormalities No significant change since last tracing Confirmed by Haze Lonni PARAS [phone]) on 10/27/2024 5:53:57 AM  Radiology: No results found.   Procedures   Medications Ordered in the ED  lactated ringers  bolus 1,000 mL (has no administration in time range)  potassium chloride (KLOR-CON) packet 60 mEq (has no administration in time range)                                    Medical Decision Making Amount and/or Complexity of Data Reviewed Labs: ordered. Decision-making details documented in ED Course. Radiology: ordered and independent interpretation performed. Decision-making details documented in ED Course. ECG/medicine tests: ordered and independent interpretation performed. Decision-making details documented in ED Course.  Risk Prescription drug management.   Differential Diagnosis considered includes, but not limited to: STEMI; NSTEMI; myocarditis; pericarditis; pulmonary embolism; aortic dissection; pneumothorax; pneumonia; gastritis; musculoskeletal pain  Presents with husband who is concerned that she is overdosing on cocaine.  He reports that she has been snorting a lot of cocaine recently, until about 3 hours ago.  Patient with complaints of headache, sore throat, chest pain.  Neurologic exam is nonfocal, patient appears somewhat sedated but no other findings.  Nasopharyngeal examination unremarkable.  EKG performed at arrival does not suggest ischemia, no comparisons.  Lab work reveals hypokalemia, patient started on oral replacement.  First troponin is negative.  Chemistry reveals mild anion gap.  CPK, lactic acid added to labs.  Fluid resuscitation  initiated.  Case will be signed out to oncoming ER physician to follow serial enzymes and remainder of workup.     Final diagnoses:  Chest pain, unspecified type  Hypokalemia  Cocaine abuse [facility])    ED Discharge Orders     None          Haze Lonni PARAS, MD 10/27/24 [phone]

## 2024-10-27 NOTE — ED Provider Notes (Signed)
  Physical Exam  BP 128/78 (BP Location: Left Arm)   Pulse 97   Temp 98.1 F (36.7 C) (Oral)   Resp 18   Ht 5' (1.524 m)   Wt 69 kg   LMP 10/20/2024   SpO2 96%   BMI 29.71 kg/m   Physical Exam  Procedures  Procedures  ED Course / MDM    Medical Decision Making Amount and/or Complexity of Data Reviewed Labs: ordered. Radiology: ordered.  Risk OTC drugs. Prescription drug management.   Assumed care of this patient from the overnight staff.  Patient had come in with chief complaint of chest pain.  She also reports off-and-on headache.  She admits to cocaine use.  Per Dr. [place], EKG is reassuring.  Initial troponin is fine.  We have added CT scan head to her workup.  If the workup remains reassuring, patient can be discharged.  On my assessment, patient reports that the headache is better, and the chest pain is also improved.  She still gets off-and-on headache and chest discomfort.  She has no acute neurodeficits or neurologic complaints.  Patient has 2+ and equal radial pulse bilaterally.  She denies any back pain.  Plan is for patient to be discharged if her second troponin is negative and she remains chest pain-free.  CT scan of the head independently interpreted no brain bleed.       Charlyn Sora, MD 10/27/24 [phone]

## 2024-10-27 NOTE — ED Triage Notes (Addendum)
 Pt bib husband who states pt is overdosing on cocaine. States last use was 3 hrs ago. Pt also c/o CP and a sore throat.

## 2024-10-27 NOTE — ED Notes (Signed)
 This RN attempted to complete an alert and orientation assessment on the pt and the pt was not able to communicate at this time.
